# Patient Record
Sex: Male | Born: 1960 | Race: White | Hispanic: No | Marital: Married | Smoking: Never smoker
Health system: Southern US, Community
[De-identification: ages and names within clinical notes are randomized; demographics above are authoritative.]

## PROBLEM LIST (undated history)

## (undated) DIAGNOSIS — C801 Malignant (primary) neoplasm, unspecified: Secondary | ICD-10-CM

## (undated) DIAGNOSIS — J309 Allergic rhinitis, unspecified: Secondary | ICD-10-CM

## (undated) DIAGNOSIS — I712 Thoracic aortic aneurysm, without rupture, unspecified: Secondary | ICD-10-CM

## (undated) DIAGNOSIS — M2550 Pain in unspecified joint: Secondary | ICD-10-CM

## (undated) DIAGNOSIS — M5430 Sciatica, unspecified side: Secondary | ICD-10-CM

## (undated) DIAGNOSIS — I7789 Other specified disorders of arteries and arterioles: Secondary | ICD-10-CM

## (undated) DIAGNOSIS — D369 Benign neoplasm, unspecified site: Secondary | ICD-10-CM

## (undated) HISTORY — DX: Thoracic aortic aneurysm, without rupture, unspecified: I71.20

## (undated) HISTORY — DX: Sciatica, unspecified side: M54.30

## (undated) HISTORY — DX: Benign neoplasm, unspecified site: D36.9

## (undated) HISTORY — PX: COLONOSCOPY: SHX174

## (undated) HISTORY — DX: Pain in unspecified joint: M25.50

## (undated) HISTORY — DX: Allergic rhinitis, unspecified: J30.9

## (undated) HISTORY — DX: Thoracic aortic aneurysm, without rupture: I71.2

## (undated) HISTORY — PX: HERNIA REPAIR: SHX51

## (undated) HISTORY — DX: Other specified disorders of arteries and arterioles: I77.89

## (undated) HISTORY — PX: KNEE ARTHROSCOPY: SUR90

## (undated) HISTORY — PX: CARPAL TUNNEL RELEASE: SHX101

## (undated) HISTORY — PX: HIP ARTHROPLASTY: SHX981

## (undated) HISTORY — PX: VASECTOMY: SHX75

---
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
## 1987-04-13 DIAGNOSIS — J189 Pneumonia, unspecified organism: Secondary | ICD-10-CM

## 1987-04-13 HISTORY — DX: Pneumonia, unspecified organism: J18.9

## 2003-12-01 ENCOUNTER — Encounter: Admission: RE | Admit: 2003-12-01 | Discharge: 2003-12-01 | Payer: Self-pay | Admitting: Family Medicine

## 2004-01-22 ENCOUNTER — Encounter: Admission: RE | Admit: 2004-01-22 | Discharge: 2004-01-22 | Payer: Self-pay | Admitting: Neurosurgery

## 2004-02-06 ENCOUNTER — Encounter: Admission: RE | Admit: 2004-02-06 | Discharge: 2004-02-06 | Payer: Self-pay | Admitting: Neurosurgery

## 2004-02-20 ENCOUNTER — Encounter: Admission: RE | Admit: 2004-02-20 | Discharge: 2004-02-20 | Payer: Self-pay | Admitting: Neurosurgery

## 2004-03-03 ENCOUNTER — Encounter: Admission: RE | Admit: 2004-03-03 | Discharge: 2004-03-03 | Payer: Self-pay | Admitting: Neurosurgery

## 2013-02-05 ENCOUNTER — Other Ambulatory Visit: Payer: Self-pay | Admitting: Gastroenterology

## 2013-02-05 DIAGNOSIS — R634 Abnormal weight loss: Secondary | ICD-10-CM

## 2013-02-05 DIAGNOSIS — R197 Diarrhea, unspecified: Secondary | ICD-10-CM

## 2013-02-09 ENCOUNTER — Ambulatory Visit
Admission: RE | Admit: 2013-02-09 | Discharge: 2013-02-09 | Disposition: A | Discharge status: Home / Self Care | Payer: BC Managed Care – PPO | Source: Ambulatory Visit | Attending: Gastroenterology | Admitting: Gastroenterology

## 2013-02-09 DIAGNOSIS — R634 Abnormal weight loss: Secondary | ICD-10-CM

## 2013-02-09 DIAGNOSIS — R197 Diarrhea, unspecified: Secondary | ICD-10-CM

## 2013-02-09 MED ORDER — IOHEXOL 300 MG/ML  SOLN
100.0000 mL | Freq: Once | INTRAMUSCULAR | Status: AC | PRN
  Administered 2013-02-09: 100 mL via INTRAVENOUS

## 2013-02-28 ENCOUNTER — Encounter: Payer: BC Managed Care – PPO | Admitting: Surgery

## 2013-03-06 DIAGNOSIS — I7789 Other specified disorders of arteries and arterioles: Secondary | ICD-10-CM | POA: Insufficient documentation

## 2013-03-07 ENCOUNTER — Encounter: Payer: Self-pay | Admitting: Surgery

## 2013-03-07 ENCOUNTER — Other Ambulatory Visit: Payer: Self-pay | Admitting: *Deleted

## 2013-03-07 ENCOUNTER — Institutional Professional Consult (permissible substitution) (INDEPENDENT_AMBULATORY_CARE_PROVIDER_SITE_OTHER): Payer: BC Managed Care – PPO | Admitting: Surgery

## 2013-03-07 VITALS — BP 110/72 | HR 62 | Resp 16 | Ht 72.0 in | Wt 191.0 lb

## 2013-03-07 DIAGNOSIS — I7102 Dissection of abdominal aorta: Secondary | ICD-10-CM

## 2013-03-07 DIAGNOSIS — I7789 Other specified disorders of arteries and arterioles: Secondary | ICD-10-CM

## 2013-03-07 NOTE — Progress Notes (Signed)
Dennis Le [id number]    PCP is Dennis Rua, MD Referring Provider is Dennis Boys, MD  Chief Complaint  Patient presents with  . Thoracic Aortic Aneurysm    Only CT ABD/PELVIS done 02/09/13    HPI:  The patient is a 52 year old active gentleman who had a CT scan of the abdomen recently for work up of diarrhea. This showed enlargement of the proximal ascending aorta to about 4 cm. The scan only showed the proximal aortic root. The descending aorta measured 26 mm. He feels that his diarrhea may have been due to Celiac disease ( which he self-diagnosed) because since he went on a gluten-free diet all of his symptoms have resolved and he feels much better.  Past Medical History  Diagnosis Date  . Ascending aorta enlargement   . Allergic rhinitis   . Sciatica   . Tubular adenoma     x 3  . Joint pain   . Thoracic aortic aneurysm without mention of rupture     Past Surgical History  Procedure Laterality Date  . Colonoscopy      07/2010  . Hernia repair    . Vasectomy    . Carpal tunnel release      LEFT HAND  . Knee arthroscopy      1977    History reviewed. No pertinent family history. No hx of cardiac valve disease or aortic aneurysms  Social History History  Substance Use Topics  . Smoking status: Never Smoker   . Smokeless tobacco: Not on file  . Alcohol Use: No    Current Outpatient Prescriptions  Medication Sig Dispense Refill  . ibuprofen (ADVIL,MOTRIN) 200 MG tablet Take 200 mg by mouth every 6 (six) hours as needed.       No current facility-administered medications for this visit.    Allergies  Allergen Reactions  . Sulfa Antibiotics Nausea Only    Review of Systems  Constitutional: Negative.   HENT: Negative.   Eyes: Negative.   Respiratory: Negative.   Cardiovascular: Negative.   Gastrointestinal: Negative.   Endocrine: Negative.   Genitourinary: Negative.   Musculoskeletal: Negative.   Skin: Negative.     Allergic/Immunologic: Negative.   Neurological: Negative.   Hematological: Negative.   Psychiatric/Behavioral: Negative.     BP 110/72  Pulse 62  Resp 16  Ht 6' (1.829 m)  Wt 191 lb (86.637 kg)  BMI 25.90 kg/m2  SpO2 98% Physical Exam  Constitutional: He is oriented to person, place, and time. He appears well-developed and well-nourished. No distress.  HENT:  Head: Normocephalic and atraumatic.  Mouth/Throat: Oropharynx is clear and moist.  Eyes: Conjunctivae and EOM are normal. Pupils are equal, round, and reactive to light.  Neck: Normal range of motion. Neck supple. No JVD present. No thyromegaly present.  Cardiovascular: Normal rate, regular rhythm, normal heart sounds and intact distal pulses.  Exam reveals no gallop and no friction rub.   No murmur heard. Pulmonary/Chest: Effort normal and breath sounds normal. No respiratory distress. He has no rales.  Abdominal: Soft. Bowel sounds are normal. He exhibits no distension and no mass. There is no tenderness.  Musculoskeletal: He exhibits no edema.  Lymphadenopathy:    He has no cervical adenopathy.  Neurological: He is alert and oriented to person, place, and time. He has normal strength. No cranial nerve deficit or sensory deficit.  Skin: Skin is warm and dry.  Psychiatric: He has a normal mood and affect.  Diagnostic Tests:  CLINICAL DATA: 50 lb weight loss (30 lb planned). Diarrhea.  Occasional night sweats. Normal colonoscopy 2012.  EXAM:  CT ABDOMEN AND PELVIS WITH CONTRAST  TECHNIQUE:  Multidetector CT imaging of the abdomen and pelvis was performed  using the standard protocol following bolus administration of  intravenous contrast.  CONTRAST: OMNIPAQUE IOHEXOL 300 MG/ML SOLN  COMPARISON: None.  FINDINGS:  No extra luminal bowel inflammatory process, free fluid or free air.  Portions of the stomach and colon are under distended and evaluation  limited for detection of a mass. No obvious mass  noted.  Fluid filled prominent size loops of small bowel most notable  jejunum. This may be related to recent ingestion of contrast  although enteritis not excluded in the proper clinical setting.  No worrisome hepatic, splenic, pancreatic or adrenal lesion. Right  renal 8 mm and left renal 3 mm low-density structure too small to  characterize. No hydronephrosis.  No calcified gallstones.  No adenopathy.  Degenerative changes lower lumbar spine without bony destructive  lesion.  Tiny calcified granuloma right lung base otherwise visualized lung  bases are clear.  Ascending thoracic aorta appears enlarged measuring up to 4 cm.  Heart size within normal limits.  No abdominal aortic aneurysm. Minimal calcifications lower abdominal  aorta.  Non contrast filled underdistended views of the urinary bladder  unremarkable. Prostate gland does not appear enlarged although  minimally lobulated. Close correlation recommended.  Fatty containing right inguinal hernia. No bowel containing hernia.  IMPRESSION:  No extra luminal bowel inflammatory process, free fluid or free air.  Portions of the stomach and colon are under distended and evaluation  limited for detection of a mass. No obvious mass noted.  Fluid filled prominent size loops of small bowel most notable  jejunum. This may be related to recent ingestion of contrast  although enteritis not excluded in the proper clinical setting.  Right renal 8 mm and left renal 3 mm low-density structure too small  to characterize.  Ascending thoracic aorta appears enlarged measuring up to 4 cm.  Prostate gland does not appear enlarged although minimally  lobulated. Close correlation recommended.  This is a call report.  Electronically Signed  By: Dennis Le M.D.  On: 02/09/2013 11:35   Impression:  Asymptomatic enlargement of the proximal ascending aorta. It is unknown how long this has been present. I have recommended a CT angio of the chest to  evaluate the entire thoracic aorta. I usually recommend periodic follow-up until the aneurysm reaches 5.5 cm or if there is a period of rapid enlargement ( greater than 5 mm in a year). I reviewed the CT scan with him and answered his questions. He is in agreement with proceeding with a CTA of the chest. I discussed the importance of good blood pressure control and his BP is normal today at 110/72. If he develops hypertension he should be on a beta blocker.   Plan:  He will have a CT Angio of the chest and I sill see him back afterwards to discuss the result.

## 2013-03-14 ENCOUNTER — Ambulatory Visit
Admission: RE | Admit: 2013-03-14 | Discharge: 2013-03-14 | Disposition: A | Discharge status: Home / Self Care | Payer: BC Managed Care – PPO | Source: Ambulatory Visit | Attending: Surgery | Admitting: Surgery

## 2013-03-14 ENCOUNTER — Ambulatory Visit (INDEPENDENT_AMBULATORY_CARE_PROVIDER_SITE_OTHER): Payer: BC Managed Care – PPO | Admitting: Surgery

## 2013-03-14 ENCOUNTER — Encounter: Payer: Self-pay | Admitting: Surgery

## 2013-03-14 VITALS — BP 121/82 | HR 68 | Resp 20 | Ht 72.0 in | Wt 191.0 lb

## 2013-03-14 DIAGNOSIS — I7102 Dissection of abdominal aorta: Secondary | ICD-10-CM

## 2013-03-14 DIAGNOSIS — I7789 Other specified disorders of arteries and arterioles: Secondary | ICD-10-CM

## 2013-03-14 MED ORDER — IOHEXOL 350 MG/ML SOLN: 100.0000 mL | Freq: Once | INTRAVENOUS | Status: AC | PRN

## 2013-03-14 NOTE — Progress Notes (Signed)
     HPI:  He returns today to discuss the results of his CTA of the chest done to assess the entire thoracic aorta after a CT of the abdomen showed some enlargement of the proximal ascending aorta.  Current Outpatient Prescriptions  Medication Sig Dispense Refill  . ibuprofen (ADVIL,MOTRIN) 200 MG tablet Take 200 mg by mouth every 6 (six) hours as needed.       No current facility-administered medications for this visit.   Facility-Administered Medications Ordered in Other Visits  Medication Dose Route Frequency Provider Last Rate Last Dose  . iohexol (OMNIPAQUE) 350 MG/ML injection 100 mL  100 mL Intravenous Once PRN Medication Radiologist, MD         Physical Exam: BP 121/82  Pulse 68  Resp 20  Ht 6' (1.829 m)  Wt 191 lb (86.637 kg)  BMI 25.90 kg/m2  SpO2 97% He looks well. Cardiac exam shows a regular rate and rhythm with normal heart sounds.  Diagnostic Tests:  CLINICAL DATA: Ascending thoracic aortic aneurysm noted on CT  abdomen  EXAM:  CT ANGIOGRAPHY CHEST WITH CONTRAST  TECHNIQUE:  Multidetector CT imaging of the chest was performed using the  standard protocol during bolus administration of intravenous  contrast. Multiplanar CT image reconstructions including MIPs were  obtained to evaluate the vascular anatomy.  CONTRAST: 100 cc Omnipaque 350  COMPARISON: None.  FINDINGS:  Maximal diameters of the ascending aorta at the sinus of Valsalva,  sino-tubular junction, and ascending aorta are 3.9 cm, 3.3 cm, and  4.3 cm.  No evidence of dissection. Great vessels are patent. Vertebral  arteries are patent.  There is no obvious filling defect in the pulmonary arterial tree to  suggest acute pulmonary thromboembolism. No evidence of intramural  hematoma.  No abnormal mediastinal adenopathy. No pericardial effusion.  No pneumothorax or pleural effusion  Calcified granuloma at the right lateral lung base. No acute bony  deformity.  Review of the MIP images  confirms the above findings.  IMPRESSION:  Maximal diameter of the ascending aorta is 4.3 cm.  Electronically Signed  By: Maryclare Bean M.D.  On: 03/14/2013 15:51    Impression:  The ascending aortic diameter is 4.3 cm. This does not require treatment at this time but will require continued followup.  Plan:  I will see him back in 1 year with a CTA of the chest.

## 2014-02-12 ENCOUNTER — Other Ambulatory Visit: Payer: Self-pay

## 2014-02-12 DIAGNOSIS — I7789 Other specified disorders of arteries and arterioles: Secondary | ICD-10-CM

## 2014-03-19 ENCOUNTER — Telehealth: Payer: Self-pay | Admitting: *Deleted

## 2014-03-19 NOTE — Telephone Encounter (Signed)
Mr. Milich is being seen for a thoracic aortic aneurysm.  He was last seen in December '14 with a CTA  CHEST and was scheduled for  repeat scan this month.  He prefers to wait and have it repeated in June of next year.  I will inform Dr. Cyndia Bent of his decision.

## 2014-03-20 ENCOUNTER — Other Ambulatory Visit: Payer: BC Managed Care – PPO

## 2014-03-20 ENCOUNTER — Ambulatory Visit: Payer: BC Managed Care – PPO | Admitting: Surgery

## 2015-02-06 ENCOUNTER — Other Ambulatory Visit: Payer: Self-pay | Admitting: *Deleted

## 2015-02-06 DIAGNOSIS — I7789 Other specified disorders of arteries and arterioles: Secondary | ICD-10-CM

## 2015-02-26 ENCOUNTER — Other Ambulatory Visit: Payer: Self-pay

## 2015-02-26 ENCOUNTER — Ambulatory Visit: Payer: Self-pay | Admitting: Surgery

## 2015-04-21 ENCOUNTER — Ambulatory Visit
Admission: RE | Admit: 2015-04-21 | Discharge: 2015-04-21 | Disposition: A | Discharge status: Home / Self Care | Payer: 59 | Source: Ambulatory Visit | Attending: Surgery | Admitting: Surgery

## 2015-04-21 DIAGNOSIS — I7789 Other specified disorders of arteries and arterioles: Secondary | ICD-10-CM

## 2015-04-21 MED ORDER — IOPAMIDOL (ISOVUE-370) INJECTION 76%
75.0000 mL | Freq: Once | INTRAVENOUS | Status: AC | PRN
  Administered 2015-04-21: 75 mL via INTRAVENOUS

## 2015-04-23 ENCOUNTER — Ambulatory Visit (INDEPENDENT_AMBULATORY_CARE_PROVIDER_SITE_OTHER): Payer: 59 | Admitting: Surgery

## 2015-04-23 ENCOUNTER — Encounter: Payer: Self-pay | Admitting: Surgery

## 2015-04-23 VITALS — BP 137/80 | HR 60 | Resp 20 | Ht 72.0 in | Wt 223.0 lb

## 2015-04-23 DIAGNOSIS — I7121 Aneurysm of the ascending aorta, without rupture: Secondary | ICD-10-CM

## 2015-04-23 DIAGNOSIS — I712 Thoracic aortic aneurysm, without rupture: Secondary | ICD-10-CM | POA: Diagnosis not present

## 2015-04-23 NOTE — Progress Notes (Signed)
      HPI:  The patient returns today for follow up of his ascending aortic aneurysm. I saw him initially in 02/2013 and his ascending aorta measured 4.3 cm at that time. I recommended follow up in one year but he called at one year and said that he wanted to wait for two years to repeat his scan. He has been doing well without any chest pain or shortness of breath.  Current Outpatient Prescriptions  Medication Sig Dispense Refill  . ibuprofen (ADVIL,MOTRIN) 200 MG tablet Take 200 mg by mouth every 6 (six) hours as needed.     No current facility-administered medications for this visit.     Physical Exam: BP 137/80 mmHg  Pulse 60  Resp 20  Ht 6' (1.829 m)  Wt 223 lb (101.152 kg)  BMI 30.24 kg/m2  SpO2 95% He looks well. Cardiac exam shows a regular rate and rhythm with normal heart sounds.  Diagnostic Tests:  CLINICAL DATA: Followup dilation of the ascending thoracic aorta.  EXAM: CT ANGIOGRAPHY CHEST WITH CONTRAST  TECHNIQUE: Multidetector CT imaging of the chest was performed using the standard protocol during bolus administration of intravenous contrast. Multiplanar CT image reconstructions and MIPs were obtained to evaluate the vascular anatomy.  CONTRAST: 75 mL of Isovue 370 intravenous contrast  COMPARISON: 03/14/2013  FINDINGS: Vascular: The ascending thoracic aorta measures 4.4 cm, previously 4.3 cm. There is no aortic dissection. Aorta is normal in caliber along the arch and descending portions. Minimal partly calcified atherosclerotic plaque is noted along the arch and descending thoracic aorta.  Neck base and axilla: No mass or adenopathy. Visualized thyroid is unremarkable.  Mediastinum and hila: Heart normal in size and configuration. No mediastinal or hilar masses or enlarged lymph nodes  Lungs and pleural: Stable calcified granuloma at the base of the right lower lobe. Lungs otherwise clear. No pleural effusion  or pneumothorax.  Limited upper abdomen unremarkable.  Musculoskeletal: Mild disc degenerative changes along the thoracic spine. Otherwise unremarkable.  Review of the MIP images confirms the above findings.  IMPRESSION: 1. Maximum diameter of the ascending thoracic aorta is 4.4 cm, previously 4.3 2. No acute findings in the chest. Stable right lower lobe calcified granuloma.   Electronically Signed  By: Lajean Manes M.D.  On: 04/21/2015 12:20  Impression:  There has been minimal change in the diameter of the ascending aorta increasing 1 mm over the past two years. This is really within the margin of error for measurement. I have recommended that we repeat the scan in one year. I stressed he importance of good blood pressure control.  Plan:  Return in one year with CTA of the chest.   Gaye Pollack, MD [facility] [phone]

## 2016-03-30 ENCOUNTER — Other Ambulatory Visit: Payer: Self-pay | Admitting: Surgery

## 2016-03-30 DIAGNOSIS — I712 Thoracic aortic aneurysm, without rupture: Secondary | ICD-10-CM

## 2016-03-30 DIAGNOSIS — I7121 Aneurysm of the ascending aorta, without rupture: Secondary | ICD-10-CM

## 2016-04-28 ENCOUNTER — Other Ambulatory Visit: Payer: 59

## 2016-04-28 ENCOUNTER — Ambulatory Visit: Payer: 59 | Admitting: Surgery

## 2018-11-08 ENCOUNTER — Other Ambulatory Visit: Payer: Self-pay

## 2018-11-08 DIAGNOSIS — Z20822 Contact with and (suspected) exposure to covid-19: Secondary | ICD-10-CM

## 2018-11-09 LAB — NOVEL CORONAVIRUS, NAA: SARS-CoV-2, NAA: NOT DETECTED

## 2020-01-15 ENCOUNTER — Other Ambulatory Visit: Payer: Self-pay | Admitting: Surgery

## 2020-01-15 DIAGNOSIS — I712 Thoracic aortic aneurysm, without rupture, unspecified: Secondary | ICD-10-CM

## 2020-02-07 ENCOUNTER — Encounter: Payer: Self-pay | Admitting: Nurse Practitioner

## 2020-02-07 ENCOUNTER — Other Ambulatory Visit: Payer: Self-pay | Admitting: Nurse Practitioner

## 2020-02-07 DIAGNOSIS — Z6830 Body mass index (BMI) 30.0-30.9, adult: Secondary | ICD-10-CM | POA: Insufficient documentation

## 2020-02-07 DIAGNOSIS — U071 COVID-19: Secondary | ICD-10-CM

## 2020-02-07 DIAGNOSIS — I7789 Other specified disorders of arteries and arterioles: Secondary | ICD-10-CM

## 2020-02-07 NOTE — Progress Notes (Signed)
I connected by phone with Dennis Le on 02/07/2020 at 6:53 PM to discuss the potential use of a new treatment for mild to moderate COVID-19 viral infection in non-hospitalized patients.  This patient is a 59 y.o. male that meets the FDA criteria for Emergency Use Authorization of COVID monoclonal antibody casirivimab/imdevimab or bamlanivimab/eteseviamb.  Has a (+) direct SARS-CoV-2 viral test result  Has mild or moderate COVID-19   Is NOT hospitalized due to COVID-19  Is within 10 days of symptom onset  Has at least one of the high risk factor(s) for progression to severe COVID-19 and/or hospitalization as defined in EUA.  Specific high risk criteria : BMI > 25 and Cardiovascular disease or hypertension   I have spoken and communicated the following to the patient or parent/caregiver regarding COVID monoclonal antibody treatment:  1. FDA has authorized the emergency use for the treatment of mild to moderate COVID-19 in adults and pediatric patients with positive results of direct SARS-CoV-2 viral testing who are 82 years of age and older weighing at least 40 kg, and who are at high risk for progressing to severe COVID-19 and/or hospitalization.  2. The significant known and potential risks and benefits of COVID monoclonal antibody, and the extent to which such potential risks and benefits are unknown.  3. Information on available alternative treatments and the risks and benefits of those alternatives, including clinical trials.  4. Patients treated with COVID monoclonal antibody should continue to self-isolate and use infection control measures (e.g., wear mask, isolate, social distance, avoid sharing personal items, clean and disinfect "high touch" surfaces, and frequent handwashing) according to CDC guidelines.   5. The patient or parent/caregiver has the option to accept or refuse COVID monoclonal antibody treatment.  After reviewing this information with the patient, the  patient has agreed to receive one of the available covid 19 monoclonal antibodies and will be provided an appropriate fact sheet prior to infusion. Orma Render, NP 02/07/2020 6:53 PM

## 2020-02-08 ENCOUNTER — Ambulatory Visit (HOSPITAL_COMMUNITY)
Admission: RE | Admit: 2020-02-08 | Discharge: 2020-02-08 | Disposition: A | Discharge status: Home / Self Care | Payer: 59 | Source: Ambulatory Visit | Attending: Pulmonary Disease | Admitting: Pulmonary Disease

## 2020-02-08 DIAGNOSIS — Z6830 Body mass index (BMI) 30.0-30.9, adult: Secondary | ICD-10-CM | POA: Insufficient documentation

## 2020-02-08 DIAGNOSIS — U071 COVID-19: Secondary | ICD-10-CM

## 2020-02-08 DIAGNOSIS — I7789 Other specified disorders of arteries and arterioles: Secondary | ICD-10-CM | POA: Insufficient documentation

## 2020-02-08 MED ORDER — METHYLPREDNISOLONE SODIUM SUCC 125 MG IJ SOLR: 125.0000 mg | Freq: Once | INTRAMUSCULAR | Status: DC | PRN

## 2020-02-08 MED ORDER — DIPHENHYDRAMINE HCL 50 MG/ML IJ SOLN: 50.0000 mg | Freq: Once | INTRAMUSCULAR | Status: DC | PRN

## 2020-02-08 MED ORDER — ALBUTEROL SULFATE HFA 108 (90 BASE) MCG/ACT IN AERS: 2.0000 | INHALATION_SPRAY | Freq: Once | RESPIRATORY_TRACT | Status: DC | PRN

## 2020-02-08 MED ORDER — EPINEPHRINE 0.3 MG/0.3ML IJ SOAJ: 0.3000 mg | Freq: Once | INTRAMUSCULAR | Status: DC | PRN

## 2020-02-08 MED ORDER — SODIUM CHLORIDE 0.9 % IV SOLN: INTRAVENOUS | Status: DC | PRN

## 2020-02-08 MED ORDER — FAMOTIDINE IN NACL 20-0.9 MG/50ML-% IV SOLN: 20.0000 mg | Freq: Once | INTRAVENOUS | Status: DC | PRN

## 2020-02-08 MED ORDER — SODIUM CHLORIDE 0.9 % IV SOLN: Freq: Once | INTRAVENOUS | Status: AC

## 2020-02-08 NOTE — Progress Notes (Signed)
  Diagnosis: COVID-19  Physician: Asencion Noble   Procedure: [facility] Med: bamlanivimab\etesevimab infusion - Provided patient with bamlanimivab\etesevimab fact sheet for patients, parents and caregivers prior to infusion.  Complications: No immediate complications noted.  Discharge: Discharged home   [place] 02/08/2020

## 2020-02-08 NOTE — Discharge Instructions (Signed)

## 2020-02-20 ENCOUNTER — Other Ambulatory Visit: Payer: Self-pay

## 2020-02-20 ENCOUNTER — Encounter: Payer: Self-pay | Admitting: Surgery

## 2020-02-20 ENCOUNTER — Ambulatory Visit: Payer: 59 | Admitting: Surgery

## 2020-02-20 ENCOUNTER — Ambulatory Visit
Admission: RE | Admit: 2020-02-20 | Discharge: 2020-02-20 | Disposition: A | Discharge status: Home / Self Care | Payer: 59 | Source: Ambulatory Visit | Attending: Surgery | Admitting: Surgery

## 2020-02-20 VITALS — BP 138/86 | HR 69 | Resp 20 | Ht 72.0 in | Wt 232.0 lb

## 2020-02-20 DIAGNOSIS — I712 Thoracic aortic aneurysm, without rupture, unspecified: Secondary | ICD-10-CM

## 2020-02-20 MED ORDER — IOPAMIDOL (ISOVUE-370) INJECTION 76%
75.0000 mL | Freq: Once | INTRAVENOUS | Status: AC | PRN
  Administered 2020-02-20: 75 mL via INTRAVENOUS

## 2020-02-20 NOTE — Progress Notes (Signed)
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  I last saw him in January 2017 and we had plan to see him in follow-up in 1 year but he just returned.  He has been feeling well without chest pain or shortness of breath.  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  He is a lifelong non-smoker and does not use smokeless tobacco..  Current Outpatient Medications  Medication Sig Dispense Refill  . ibuprofen (ADVIL,MOTRIN) 200 MG tablet Take 200 mg by mouth every 6 (six) hours as needed.     No current facility-administered medications for this visit.     Physical Exam: BP 138/86   Pulse 69   Resp 20   Ht 6' (1.829 m)   Wt 232 lb (105.2 kg)   SpO2 95% Comment: RA  BMI 31.46 kg/m  He looks well. There is no cervical or supraclavicular adenopathy. Cardiac exam shows regular rate and rhythm with normal heart sounds.  There is no murmur. Lungs are clear.  Diagnostic Tests:  Narrative & Impression  CLINICAL DATA:  Follow-up thoracic aortic aneurysm  EXAM: CT ANGIOGRAPHY CHEST WITH CONTRAST  TECHNIQUE: Multidetector CT imaging of the chest was performed using the standard protocol during bolus administration of intravenous contrast. Multiplanar CT image reconstructions and MIPs were obtained to evaluate the vascular anatomy.  CONTRAST:  24mL ISOVUE-370 IOPAMIDOL (ISOVUE-370) INJECTION 76%  COMPARISON:  04/21/2015  FINDINGS: Cardiovascular: Normal heart size. No pericardial effusion. Preferential opacification of the thoracic aorta. Maximal diameter of the mid ascending thoracic aorta is 4.3 cm, unchanged from prior. Diameter of the aortic arch and descending thoracic aorta are normal. No aortic dissection. Three vessel arch, with widely patent branch vessels. Minimal atherosclerotic calcification along the aortic arch.  Central pulmonary vasculature is within normal limits. No central pulmonary arterial filling defect.  Mediastinum/Nodes: No enlarged mediastinal, hilar, or axillary lymph nodes. Thyroid gland, trachea, and esophagus demonstrate no significant findings.  Lungs/Pleura: Part solid pulmonary nodule within the peripheral aspect of the right upper lobe measuring 10 mm in diameter with solid component measuring approximately 8 mm (series 6, image 47), new from prior. There are a few scattered areas of ground-glass opacity within the right upper lobe and right middle lobe (for example series 6, images 55 and 105). Additional small focal area of ground-glass within the periphery of the left lower lobe (series 6, image 90). Stable 5 mm calcified granuloma within the right lung base. No pleural effusion or pneumothorax.  Upper Abdomen: No acute abnormality.  Musculoskeletal: No chest wall abnormality. No acute or significant osseous findings.  Review of the MIP images confirms the above findings.  IMPRESSION: 1. Stable aneurysmal dilatation of the mid ascending thoracic aorta measuring up to 4.3 cm in diameter. 2. Part solid pulmonary nodule within the peripheral aspect of the right upper lobe measuring 10 mm in diameter, new from prior exam. Follow-up non-contrast CT recommended at 3-6 months to confirm persistence. If unchanged, and solid component is <6 mm, annual CT is recommended until 5 years of stability has been established. If persistent these nodules should be considered highly suspicious if the solid component of the nodule is 6 mm or greater in size and enlarging. This recommendation follows the consensus statement: Guidelines for Management of Incidental Pulmonary Nodules Detected on CT Images: From the Fleischner Society 2017;  Radiology 2017; [phone]. 3. There are a few scattered areas of ground-glass opacity within the right upper lobe, right middle lobe, and left  lower lobe. Findings may represent an infectious or inflammatory process. Attention on follow-up.  These results will be called to the ordering clinician or representative by the Radiologist Assistant, and communication documented in the PACS or [employer].   Electronically Signed   By: Davina Poke D.O.   On: 02/20/2020 09:13     Impression:  This 59 year old gentleman has a stable 4.3 cm fusiform ascending aortic aneurysm.  This is stable dating back to 59  It is well below the surgical threshold of 5.5 cm.  Given its long history of stability I think it is reasonable to wait 2 years to repeat CT scan for this aneurysm.  I reviewed the CT images with him and answered his questions.  I stressed the importance of continued good blood pressure control and preventing further enlargement and acute aortic dissection.  His CT scan today also showed a new 10 mm part solid nodule in the peripheral aspect of the right upper lobe.  Radiology has recommended a follow-up CT of the chest in 3 to 6 months.  There are also a few other scattered areas of groundglass opacity in the right upper lobe right middle lobe and left lower lobe which are likely inflammatory or infectious.  These can be followed up on subsequent scanning.  I reviewed the images with him and answered his questions.  We decided to repeat his CT scan of the chest without contrast in 4 months.  Plan:  He will return to see me in 4 months with a CT scan of the chest without contrast to follow-up on his new right upper lobe pulmonary nodule.  We will schedule further follow-up scans depending on results of that scan.  He will require follow-up of his ascending aortic aneurysm in about 2 years.  I spent 20 minutes performing this established patient evaluation and > 50% of this time was spent face to face counseling and coordinating the care of this patient's aortic aneurysm and new right upper lobe pulmonary  nodule.    Gaye Pollack, MD [facility] [phone]

## 2020-05-19 ENCOUNTER — Other Ambulatory Visit: Payer: Self-pay | Admitting: Surgery

## 2020-05-19 DIAGNOSIS — R911 Solitary pulmonary nodule: Secondary | ICD-10-CM

## 2020-06-18 ENCOUNTER — Other Ambulatory Visit: Payer: 59

## 2020-06-18 ENCOUNTER — Ambulatory Visit: Payer: 59 | Admitting: Surgery

## 2020-08-13 ENCOUNTER — Other Ambulatory Visit: Payer: 59

## 2020-08-13 ENCOUNTER — Encounter: Payer: Self-pay | Admitting: Surgery

## 2020-08-13 ENCOUNTER — Ambulatory Visit: Payer: 59 | Admitting: Surgery

## 2020-08-13 ENCOUNTER — Other Ambulatory Visit: Payer: Self-pay

## 2020-08-13 ENCOUNTER — Ambulatory Visit
Admission: RE | Admit: 2020-08-13 | Discharge: 2020-08-13 | Disposition: A | Discharge status: Home / Self Care | Payer: 59 | Source: Ambulatory Visit | Attending: Surgery | Admitting: Surgery

## 2020-08-13 VITALS — BP 134/78 | HR 79 | Resp 20 | Ht 72.0 in | Wt 212.0 lb

## 2020-08-13 DIAGNOSIS — I712 Thoracic aortic aneurysm, without rupture, unspecified: Secondary | ICD-10-CM

## 2020-08-13 DIAGNOSIS — R911 Solitary pulmonary nodule: Secondary | ICD-10-CM | POA: Diagnosis not present

## 2020-08-13 NOTE — Progress Notes (Signed)
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 His last CT in November 2021 also showed  a new 10 mm part solid nodule in the peripheral aspect of the right upper lobe.  Radiology has recommended a follow-up CT of the chest in 3 to 6 months.  There are also a few other scattered areas of groundglass opacity in the right upper lobe right middle lobe and left lower lobe which are likely inflammatory or infectious. He has been feeling well with no cough or shortness of breath.   Current Outpatient Medications  Medication Sig Dispense Refill  . ibuprofen (ADVIL,MOTRIN) 200 MG tablet Take 200 mg by mouth every 6 (six) hours as needed.     No current facility-administered medications for this visit.     Physical Exam: BP 134/78   Pulse 79   Resp 20   Ht 6' (1.829 m)   Wt 96.2 kg   SpO2 95% Comment: RA  BMI 28.75 kg/m  He looks well There is no cervical or supraclavicular adenopathy. Cardiac exam shows a regular rate and rhythm with normal heart sounds. This is no murmur. Lungs are clear.  Diagnostic Tests:  Narrative & Impression  CLINICAL DATA:  Follow up right upper lobe pulmonary nodule. Cough with allergens. No history of malignancy or prior relevant surgery.  EXAM: CT CHEST WITHOUT CONTRAST  TECHNIQUE: Multidetector CT imaging of the chest was performed following the standard protocol without IV contrast.  COMPARISON:  Chest CT 02/20/2020 and 04/21/2015.  FINDINGS: Cardiovascular: Minimal aortic and coronary artery atherosclerosis again noted. There is stable dilatation of the ascending aorta to 4.3 cm. No acute vascular findings on noncontrast imaging. The heart size is normal. There is no pericardial effusion.  Mediastinum/Nodes: There are no enlarged mediastinal, hilar or axillary lymph nodes.Hilar assessment is limited by the lack  of intravenous contrast, although the hilar contours appear unchanged. The thyroid gland, trachea and esophagus demonstrate no significant findings.  Lungs/Pleura: There is no pleural effusion or pneumothorax. Interval resolution of dominant part solid nodule previously demonstrated in the right upper lobe. Additional scattered areas of ground-glass density have also resolved. A few scattered tiny subpleural nodules are stable, consistent with benign findings. There are small calcified right lung granulomas. No suspicious pulmonary nodules.  Upper abdomen: The visualized upper abdomen appears stable without significant findings.  Musculoskeletal/Chest wall: Interval development of several posterior rib fractures on the left involving the 5th through 8th ribs. The fractures of the 6 and 7th ribs are mildly displaced. These fractures appear subacute with some callus formation. No chest wall mass.  IMPRESSION: 1. Interval resolution of dominant part solid nodule in the right upper lobe and additional patchy ground-glass opacities in both lungs. No suspicious pulmonary nodules. 2. Interval development of several left-sided rib fractures posteriorly, including mildly displaced fractures of the left 6th and 7th ribs. These fractures appear subacute. 3. Stable dilatation of the ascending aorta having a maximal diameter of 4.3 cm. Mild Aortic Atherosclerosis (ICD10-I70.0).   Electronically Signed   By: Richardean Sale M.D.   On: 08/13/2020 10:32      Impression:  There has been Interval resolution of the dominant part solid nodule in the right upper lobe and additional patchy ground-glass opacities in both lungs. There are no suspicious pulmonary nodules. The 4.3 cm fusiform ascending aortic aneurysm is stable. I reviewed  the CT images with the patient and answered his questions.     Plan:  He will return in one year with a CTA of the chest to follow up on his ascending  aortic aneurysm.  I spent 20 minutes performing this established patient evaluation and > 50% of this time was spent face to face counseling and coordinating the care of this patient's pulmonary nodule and aortic aneurysm.    Gaye Pollack, MD [facility] [phone]

## 2020-11-18 ENCOUNTER — Other Ambulatory Visit: Payer: Self-pay

## 2021-02-04 NOTE — Progress Notes (Signed)
Sent message, via epic in basket, requesting orders in epic from surgeon.  

## 2021-02-09 ENCOUNTER — Ambulatory Visit: Payer: Self-pay | Admitting: Student

## 2021-02-09 NOTE — H&P (View-Only) (Signed)
TOTAL KNEE ADMISSION H&P  Patient is being admitted for left total knee arthroplasty.  Subjective:  Chief Complaint:left knee pain.  HPI: Dennis Le, 60 y.o. male, has a history of pain and functional disability in the left knee due to arthritis and has failed non-surgical conservative treatments for greater than 12 weeks to includeNSAID's and/or analgesics, corticosteriod injections, and activity modification.  Onset of symptoms was gradual, starting 3 years ago with gradually worsening course since that time. The patient noted prior procedures on the knee to include  menisectomy on the left knee(s).  Patient currently rates pain in the left knee(s) at 8 out of 10 with activity. Patient has worsening of pain with activity and weight bearing, pain that interferes with activities of daily living, and pain with passive range of motion.  Patient has evidence of subchondral cysts, subchondral sclerosis, and joint space narrowing by imaging studies. There is no active infection.  Patient Active Problem List   Diagnosis Date Noted   COVID-19 02/07/2020   BMI 30.0-30.9,adult 02/07/2020   Ascending aorta enlargement [facility])    Past Medical History:  Diagnosis Date   Allergic rhinitis    Ascending aorta enlargement (HCC)    Joint pain    Sciatica    Thoracic aortic aneurysm without mention of rupture    Tubular adenoma    x 3    Past Surgical History:  Procedure Laterality Date   CARPAL TUNNEL RELEASE     LEFT HAND   COLONOSCOPY     07/2010   HERNIA REPAIR     KNEE ARTHROSCOPY     1977   VASECTOMY      Current Outpatient Medications  Medication Sig Dispense Refill Last Dose   ibuprofen (ADVIL,MOTRIN) 200 MG tablet Take 200 mg by mouth every 6 (six) hours as needed.      No current facility-administered medications for this visit.   Allergies  Allergen Reactions   Sulfa Antibiotics Nausea Only    Social History   Tobacco Use   Smoking status: Never   Smokeless tobacco:  Never  Substance Use Topics   Alcohol use: No    No family history on file.   Review of Systems  Musculoskeletal:  Positive for arthralgias.  All other systems reviewed and are negative.  Objective:  Physical Exam HENT:     Head: Normocephalic.  Eyes:     Pupils: Pupils are equal, round, and reactive to light.  Cardiovascular:     Rate and Rhythm: Normal rate.  Pulmonary:     Effort: Pulmonary effort is normal.  Abdominal:     Palpations: Abdomen is soft.  Genitourinary:    Comments: Deferred Musculoskeletal:        General: Tenderness present.     Cervical back: Normal range of motion.  Skin:    General: Skin is warm and dry.  Neurological:     Mental Status: He is alert and oriented to person, place, and time.  Psychiatric:        Behavior: Behavior normal.    Vital signs in last 24 hours: @VSRANGES @  Labs:   Estimated body mass index is 28.75 kg/m as calculated from the following:   Height as of 08/13/20: 6' (1.829 m).   Weight as of 08/13/20: 96.2 kg.   Imaging Review Plain radiographs demonstrate severe degenerative joint disease of the left knee(s). The bone quality appears to be adequate for age and reported activity level.      Assessment/Plan:  End  stage arthritis, left knee   The patient history, physical examination, clinical judgment of the provider and imaging studies are consistent with end stage degenerative joint disease of the left knee(s) and total knee arthroplasty is deemed medically necessary. The treatment options including medical management, injection therapy arthroscopy and arthroplasty were discussed at length. The risks and benefits of total knee arthroplasty were presented and reviewed. The risks due to aseptic loosening, infection, stiffness, patella tracking problems, thromboembolic complications and other imponderables were discussed. The patient acknowledged the explanation, agreed to proceed with the plan and consent was signed.  Patient is being admitted for inpatient treatment for surgery, pain control, PT, OT, prophylactic antibiotics, VTE prophylaxis, progressive ambulation and ADL's and discharge planning. The patient is planning to be discharged  home same day     Patient's anticipated LOS is less than 2 midnights, meeting these requirements: - Younger than 38 - Lives within 1 hour of care - Has a competent adult at home to recover with post-op recover - NO history of  - Chronic pain requiring opiods  - Diabetes  - Coronary Artery Disease  - Heart failure  - Heart attack  - Stroke  - DVT/VTE  - Cardiac arrhythmia  - Respiratory Failure/COPD  - Renal failure  - Anemia  - Advanced Liver disease

## 2021-02-09 NOTE — H&P (Signed)
TOTAL KNEE ADMISSION H&P  Patient is being admitted for left total knee arthroplasty.  Subjective:  Chief Complaint:left knee pain.  HPI: Dennis Le, 60 y.o. male, has a history of pain and functional disability in the left knee due to arthritis and has failed non-surgical conservative treatments for greater than 12 weeks to includeNSAID's and/or analgesics, corticosteriod injections, and activity modification.  Onset of symptoms was gradual, starting 3 years ago with gradually worsening course since that time. The patient noted prior procedures on the knee to include  menisectomy on the left knee(s).  Patient currently rates pain in the left knee(s) at 8 out of 10 with activity. Patient has worsening of pain with activity and weight bearing, pain that interferes with activities of daily living, and pain with passive range of motion.  Patient has evidence of subchondral cysts, subchondral sclerosis, and joint space narrowing by imaging studies. There is no active infection.  Patient Active Problem List   Diagnosis Date Noted   COVID-19 02/07/2020   BMI 30.0-30.9,adult 02/07/2020   Ascending aorta enlargement [facility])    Past Medical History:  Diagnosis Date   Allergic rhinitis    Ascending aorta enlargement (HCC)    Joint pain    Sciatica    Thoracic aortic aneurysm without mention of rupture    Tubular adenoma    x 3    Past Surgical History:  Procedure Laterality Date   CARPAL TUNNEL RELEASE     LEFT HAND   COLONOSCOPY     07/2010   HERNIA REPAIR     KNEE ARTHROSCOPY     1977   VASECTOMY      Current Outpatient Medications  Medication Sig Dispense Refill Last Dose   ibuprofen (ADVIL,MOTRIN) 200 MG tablet Take 200 mg by mouth every 6 (six) hours as needed.      No current facility-administered medications for this visit.   Allergies  Allergen Reactions   Sulfa Antibiotics Nausea Only    Social History   Tobacco Use   Smoking status: Never   Smokeless tobacco:  Never  Substance Use Topics   Alcohol use: No    No family history on file.   Review of Systems  Musculoskeletal:  Positive for arthralgias.  All other systems reviewed and are negative.  Objective:  Physical Exam HENT:     Head: Normocephalic.  Eyes:     Pupils: Pupils are equal, round, and reactive to light.  Cardiovascular:     Rate and Rhythm: Normal rate.  Pulmonary:     Effort: Pulmonary effort is normal.  Abdominal:     Palpations: Abdomen is soft.  Genitourinary:    Comments: Deferred Musculoskeletal:        General: Tenderness present.     Cervical back: Normal range of motion.  Skin:    General: Skin is warm and dry.  Neurological:     Mental Status: He is alert and oriented to person, place, and time.  Psychiatric:        Behavior: Behavior normal.    Vital signs in last 24 hours: @VSRANGES @  Labs:   Estimated body mass index is 28.75 kg/m as calculated from the following:   Height as of 08/13/20: 6' (1.829 m).   Weight as of 08/13/20: 96.2 kg.   Imaging Review Plain radiographs demonstrate severe degenerative joint disease of the left knee(s). The bone quality appears to be adequate for age and reported activity level.      Assessment/Plan:  End  stage arthritis, left knee   The patient history, physical examination, clinical judgment of the provider and imaging studies are consistent with end stage degenerative joint disease of the left knee(s) and total knee arthroplasty is deemed medically necessary. The treatment options including medical management, injection therapy arthroscopy and arthroplasty were discussed at length. The risks and benefits of total knee arthroplasty were presented and reviewed. The risks due to aseptic loosening, infection, stiffness, patella tracking problems, thromboembolic complications and other imponderables were discussed. The patient acknowledged the explanation, agreed to proceed with the plan and consent was signed.  Patient is being admitted for inpatient treatment for surgery, pain control, PT, OT, prophylactic antibiotics, VTE prophylaxis, progressive ambulation and ADL's and discharge planning. The patient is planning to be discharged  home same day     Patient's anticipated LOS is less than 2 midnights, meeting these requirements: - Younger than 5 - Lives within 1 hour of care - Has a competent adult at home to recover with post-op recover - NO history of  - Chronic pain requiring opiods  - Diabetes  - Coronary Artery Disease  - Heart failure  - Heart attack  - Stroke  - DVT/VTE  - Cardiac arrhythmia  - Respiratory Failure/COPD  - Renal failure  - Anemia  - Advanced Liver disease

## 2021-02-11 ENCOUNTER — Ambulatory Visit: Payer: Self-pay | Admitting: Student

## 2021-02-11 DIAGNOSIS — M1712 Unilateral primary osteoarthritis, left knee: Secondary | ICD-10-CM

## 2021-02-11 NOTE — Patient Instructions (Addendum)
DUE TO COVID-19 ONLY ONE VISITOR IS ALLOWED TO COME WITH YOU AND STAY IN THE WAITING ROOM ONLY DURING PRE OP AND PROCEDURE DAY OF SURGERY IF YOU ARE GOING HOME AFTER SURGERY.                 Dennis Le     Your procedure is scheduled on: 02/25/21   Report to [facility] Main  Entrance   Report to short stay at 5:45 AM     Call this number if you have problems the morning of surgery [phone]    No food after midnight.    You may have clear liquid until 5:30 AM.    At 5:00 AM drink pre surgery drink.   Nothing by mouth after 5:30 AM.   CLEAR LIQUID DIET   Foods Allowed                                                                     Foods Excluded                                                                                        liquids that you cannot  Plain Jell-O any favor except red or purple                                           see through such as: Fruit ices (not with fruit pulp)                                     milk, soups, orange juice  Iced Popsicles                                    All solid food Carbonated beverages, regular and diet                                    Cranberry, grape and apple juices Sports drinks like Gatorade Lightly seasoned clear broth or consume(fat free) Sugar     BRUSH YOUR TEETH MORNING OF SURGERY AND RINSE YOUR MOUTH OUT, NO CHEWING GUM CANDY OR MINTS.     Take these medicines the morning of surgery with A SIP OF WATER: none                                You may not have any metal on your body including              piercings  Do not wear  jewelry, lotions, powders or  deodorant             Men may shave face and neck.   Do not bring valuables to the hospital. [place].  Contacts, dentures or bridgework may not be worn into surgery.     Patients discharged the day of surgery will not be allowed to drive home.  IF YOU ARE HAVING SURGERY AND  GOING HOME THE SAME DAY, YOU MUST HAVE AN ADULT TO DRIVE YOU HOME AND BE WITH YOU FOR 24 HOURS. YOU MAY GO HOME BY TAXI OR UBER OR ORTHERWISE, BUT AN ADULT MUST ACCOMPANY YOU HOME AND STAY WITH YOU FOR 24 HOURS.  Name and phone number of your driver:  Special Instructions: N/A              Please read over the following fact sheets you were given: _____________________________________________________________________             [facility] - Preparing for Surgery Before surgery, you can play an important role.  Because skin is not sterile, your skin needs to be as free of germs as possible.  You can reduce the number of germs on your skin by washing with CHG (chlorahexidine gluconate) soap before surgery.  CHG is an antiseptic cleaner which kills germs and bonds with the skin to continue killing germs even after washing. Please DO NOT use if you have an allergy to CHG or antibacterial soaps.  If your skin becomes reddened/irritated stop using the CHG and inform your nurse when you arrive at Short Stay. You may shave your face/neck. Please follow these instructions carefully:  1.  Shower with CHG Soap the night before surgery and the  morning of Surgery.  2.  If you choose to wash your hair, wash your hair first as usual with your  normal  shampoo.  3.  After you shampoo, rinse your hair and body thoroughly to remove the  shampoo.                            4.  Use CHG as you would any other liquid soap.  You can apply chg directly  to the skin and wash                       Gently with a scrungie or clean washcloth.  5.  Apply the CHG Soap to your body ONLY FROM THE NECK DOWN.   Do not use on face/ open                           Wound or open sores. Avoid contact with eyes, ears mouth and genitals (private parts).                       Wash face,  Genitals (private parts) with your normal soap.             6.  Wash thoroughly, paying special attention to the area where your surgery  will be  performed.  7.  Thoroughly rinse your body with warm water from the neck down.  8.  DO NOT shower/wash with your normal soap after using and rinsing off  the CHG Soap.  9.  Pat yourself dry with a clean towel.            10.  Wear clean pajamas.            11.  Place clean sheets on your bed the night of your first shower and do not  sleep with pets. Day of Surgery : Do not apply any lotions/deodorants the morning of surgery.  Please wear clean clothes to the hospital/surgery center.  FAILURE TO FOLLOW THESE INSTRUCTIONS MAY RESULT IN THE CANCELLATION OF YOUR SURGERY PATIENT SIGNATURE_________________________________  NURSE SIGNATURE__________________________________  ________________________________________________________________________   Dennis Le  An incentive spirometer is a tool that can help keep your lungs clear and active. This tool measures how well you are filling your lungs with each breath. Taking long deep breaths may help reverse or decrease the chance of developing breathing (pulmonary) problems (especially infection) following: A long period of time when you are unable to move or be active. BEFORE THE PROCEDURE  If the spirometer includes an indicator to show your best effort, your nurse or respiratory therapist will set it to a desired goal. If possible, sit up straight or lean slightly forward. Try not to slouch. Hold the incentive spirometer in an upright position. INSTRUCTIONS FOR USE  Sit on the edge of your bed if possible, or sit up as far as you can in bed or on a chair. Hold the incentive spirometer in an upright position. Breathe out normally. Place the mouthpiece in your mouth and seal your lips tightly around it. Breathe in slowly and as deeply as possible, raising the piston or the ball toward the top of the column. Hold your breath for 3-5 seconds or for as long as possible. Allow the piston or ball to fall to the bottom of the  column. Remove the mouthpiece from your mouth and breathe out normally. Rest for a few seconds and repeat Steps 1 through 7 at least 10 times every 1-2 hours when you are awake. Take your time and take a few normal breaths between deep breaths. The spirometer may include an indicator to show your best effort. Use the indicator as a goal to work toward during each repetition. After each set of 10 deep breaths, practice coughing to be sure your lungs are clear. If you have an incision (the cut made at the time of surgery), support your incision when coughing by placing a pillow or rolled up towels firmly against it. Once you are able to get out of bed, walk around indoors and cough well. You may stop using the incentive spirometer when instructed by your caregiver.  RISKS AND COMPLICATIONS Take your time so you do not get dizzy or light-headed. If you are in pain, you may need to take or ask for pain medication before doing incentive spirometry. It is harder to take a deep breath if you are having pain. AFTER USE Rest and breathe slowly and easily. It can be helpful to keep track of a log of your progress. Your caregiver can provide you with a simple table to help with this. If you are using the spirometer at home, follow these instructions: [place] IF:  You are having difficultly using the spirometer. You have trouble using the spirometer as often as instructed. Your pain medication is not giving enough relief while using the spirometer. You develop fever of 100.5 F (38.1 C) or higher. SEEK IMMEDIATE MEDICAL CARE IF:  You cough up bloody sputum that had not  been present before. You develop fever of 102 F (38.9 C) or greater. You develop worsening pain at or near the incision site. MAKE SURE YOU:  Understand these instructions. Will watch your condition. Will get help right away if you are not doing well or get worse. Document Released: 08/09/2006 Document Revised: 06/21/2011  Document Reviewed: 10/10/2006 [facility] Patient Information 2014 [place], [place].   ________________________________________________________________________

## 2021-02-12 ENCOUNTER — Other Ambulatory Visit: Payer: Self-pay

## 2021-02-12 ENCOUNTER — Encounter (HOSPITAL_COMMUNITY): Payer: Self-pay

## 2021-02-12 ENCOUNTER — Encounter (HOSPITAL_COMMUNITY)
Admission: RE | Admit: 2021-02-12 | Discharge: 2021-02-12 | Disposition: A | Discharge status: Home / Self Care | Payer: 59 | Source: Ambulatory Visit | Attending: Orthopedic Surgery | Admitting: Orthopedic Surgery

## 2021-02-12 VITALS — BP 134/80 | HR 74 | Temp 98.0°F | Resp 18 | Ht 71.0 in | Wt 216.5 lb

## 2021-02-12 DIAGNOSIS — Z01812 Encounter for preprocedural laboratory examination: Secondary | ICD-10-CM | POA: Insufficient documentation

## 2021-02-12 DIAGNOSIS — I714 Abdominal aortic aneurysm, without rupture, unspecified: Secondary | ICD-10-CM | POA: Diagnosis not present

## 2021-02-12 DIAGNOSIS — M1712 Unilateral primary osteoarthritis, left knee: Secondary | ICD-10-CM | POA: Diagnosis not present

## 2021-02-12 DIAGNOSIS — Z01818 Encounter for other preprocedural examination: Secondary | ICD-10-CM

## 2021-02-12 LAB — COMPREHENSIVE METABOLIC PANEL
ALT: 16 U/L (ref 0–44)
AST: 24 U/L (ref 15–41)
Albumin: 4.1 g/dL (ref 3.5–5.0)
Alkaline Phosphatase: 114 U/L (ref 38–126)
Anion gap: 8 (ref 5–15)
BUN: 14 mg/dL (ref 8–23)
CO2: 26 mmol/L (ref 22–32)
Calcium: 9.1 mg/dL (ref 8.9–10.3)
Chloride: 102 mmol/L (ref 98–111)
Creatinine, Ser: 0.89 mg/dL (ref 0.61–1.24)
GFR, Estimated: >60 mL/min (ref >60)
Glucose, Bld: 103 mg/dL — ABNORMAL HIGH (ref 70–99)
Potassium: 4 mmol/L (ref 3.5–5.1)
Sodium: 136 mmol/L (ref 135–145)
Total Bilirubin: 0.6 mg/dL (ref 0.3–1.2)
Total Protein: 7.3 g/dL (ref 6.5–8.1)

## 2021-02-12 LAB — CBC
HCT: 42.1 % (ref 39.0–52.0)
Hemoglobin: 14.1 g/dL (ref 13.0–17.0)
MCH: 30.9 pg (ref 26.0–34.0)
MCHC: 33.5 g/dL (ref 30.0–36.0)
MCV: 92.1 fL (ref 80.0–100.0)
Platelets: 324 10*3/uL (ref 150–400)
RBC: 4.57 MIL/uL (ref 4.22–5.81)
RDW: 13.2 % (ref 11.5–15.5)
WBC: 6.5 10*3/uL (ref 4.0–10.5)
nRBC: 0 % (ref 0.0–0.2)

## 2021-02-12 LAB — SURGICAL PCR SCREEN
MRSA, PCR: NEGATIVE
Staphylococcus aureus: NEGATIVE

## 2021-02-12 LAB — TYPE AND SCREEN
ABO/RH(D): A NEG
Antibody Screen: NEGATIVE

## 2021-02-12 LAB — PROTIME-INR
INR: 1 (ref 0.8–1.2)
Prothrombin Time: 13.2 seconds (ref 11.4–15.2)

## 2021-02-12 NOTE — Progress Notes (Signed)
COVID test- NA  PCP - Dr. Anne Shutter Cardiothoracic- Dr. Ellison Hughs LOV 08/13/20  Chest x-ray - Chest CT 08/13/20 EKG - no Stress Test - no ECHO - no Cardiac Cath - no Pacemaker/ICD device last checked:NA  Sleep Study - no CPAP -   Fasting Blood Sugar - NA Checks Blood Sugar _____ times a day  Blood Thinner Instructions:NA Aspirin Instructions: Last Dose:  Anesthesia review: no  Patient denies shortness of breath, fever, cough and chest pain at PAT appointment  Pt has no SOB with any activities. Pt has aortic enlargement that is being monitored.   Patient verbalized understanding of instructions that were given to them at the PAT appointment. Patient was also instructed that they will need to review over the PAT instructions again at home before surgery. yes

## 2021-02-16 NOTE — Anesthesia Preprocedure Evaluation (Addendum)
Anesthesia Evaluation  Patient identified by MRN, date of birth, ID band Patient awake    Reviewed: Allergy & Precautions, NPO status , Patient's Chart, lab work & pertinent test results  History of Anesthesia Complications Negative for: history of anesthetic complications  Airway Mallampati: II  TM Distance: >3 FB Neck ROM: Full    Dental  (+) Dental Advisory Given, Teeth Intact   Pulmonary neg pulmonary ROS,    Pulmonary exam normal        Cardiovascular Normal cardiovascular exam   Hx TAA    Neuro/Psych  Neuromuscular disease negative psych ROS   GI/Hepatic negative GI ROS, Neg liver ROS,   Endo/Other   Obesity   Renal/GU negative Renal ROS     Musculoskeletal negative musculoskeletal ROS (+)   Abdominal   Peds  Hematology negative hematology ROS (+)   Anesthesia Other Findings   Reproductive/Obstetrics                            Anesthesia Physical Anesthesia Plan  ASA: 2  Anesthesia Plan: Spinal   Post-op Pain Management:  Regional for Post-op pain   Induction:   PONV Risk Score and Plan: 1 and Treatment may vary due to age or medical condition and Propofol infusion  Airway Management Planned: Natural Airway and Simple Face Mask  Additional Equipment: None  Intra-op Plan:   Post-operative Plan:   Informed Consent: I have reviewed the patients History and Physical, chart, labs and discussed the procedure including the risks, benefits and alternatives for the proposed anesthesia with the patient or authorized representative who has indicated his/her understanding and acceptance.       Plan Discussed with: CRNA and Anesthesiologist  Anesthesia Plan Comments: (Labs reviewed, platelets acceptable. Discussed risks and benefits of spinal, including spinal/epidural hematoma, infection, failed block, and PDPH. Patient expressed understanding and wished to proceed. )       Anesthesia Quick Evaluation

## 2021-02-16 NOTE — Progress Notes (Signed)
Anesthesia Chart Review   Case: [id number] Date/Time: 02/25/21 0815   Procedure: COMPUTER ASSISTED TOTAL KNEE ARTHROPLASTY (Left: Knee)   Anesthesia type: Spinal   Pre-op diagnosis: Left knee osteoarthritis   Location: [facility] ROOM 07 / [facility]   Surgeons: Rod Can, MD       DISCUSSION:60 y.o. never smoker with h/o AAA, left knee OA scheduled for above procedure 02/25/2021 with Dr. Rod Can.   Pt last seen by cardiothoracic surgery 08/13/2020. 4.3 cm AAA stable, 1 year follow up recommended.   Anticipate pt can proceed with planned procedure barring acute status change.   VS: BP 134/80   Pulse 74   Temp 36.7 C (Oral)   Resp 18   Ht 5\' 11"  (1.803 m)   Wt 98.2 kg   SpO2 97%   BMI 30.20 kg/m   PROVIDERS: Orpah Melter, MD is PCP   Gilford Raid, MD is Cardiothoracic Surgeon LABS: Labs reviewed: Acceptable for surgery. (all labs ordered are listed, but only abnormal results are displayed)  Labs Reviewed  COMPREHENSIVE METABOLIC PANEL - Abnormal; Notable for the following components:      Result Value   Glucose, Bld 103 (*)    All other components within normal limits  SURGICAL PCR SCREEN  CBC  PROTIME-INR  TYPE AND SCREEN     IMAGES: CT Chest 08/13/2020 IMPRESSION: 1. Interval resolution of dominant part solid nodule in the right upper lobe and additional patchy ground-glass opacities in both lungs. No suspicious pulmonary nodules. 2. Interval development of several left-sided rib fractures posteriorly, including mildly displaced fractures of the left 6th and 7th ribs. These fractures appear subacute. 3. Stable dilatation of the ascending aorta having a maximal diameter of 4.3 cm. Mild Aortic Atherosclerosis (ICD10-I70.0).  EKG:   CV:  Past Medical History:  Diagnosis Date   Allergic rhinitis    Ascending aorta enlargement (HCC)    Joint pain    Sciatica    Thoracic aortic aneurysm without mention of rupture    Tubular adenoma    x 3    Past  Surgical History:  Procedure Laterality Date   CARPAL TUNNEL RELEASE     LEFT HAND   COLONOSCOPY     07/2010   HERNIA REPAIR     KNEE ARTHROSCOPY Left    1977   MEDIAL COLLATERAL LIGAMENT REPAIR, KNEE Left 1978   with pin   VASECTOMY      MEDICATIONS:  acetaminophen (TYLENOL) 500 MG tablet   Ascorbic Acid (VITAMIN C PO)   Cyanocobalamin (VITAMIN B-12 PO)   No current facility-administered medications for this encounter.    Konrad Felix Ward, PA-C [facility] [phone]

## 2021-02-25 ENCOUNTER — Encounter (HOSPITAL_COMMUNITY)
Admission: RE | Disposition: A | Discharge status: Home / Self Care | Payer: Self-pay | Source: Ambulatory Visit | Attending: Orthopedic Surgery

## 2021-02-25 ENCOUNTER — Ambulatory Visit (HOSPITAL_COMMUNITY): Payer: 59

## 2021-02-25 ENCOUNTER — Ambulatory Visit (HOSPITAL_COMMUNITY): Payer: 59 | Admitting: Anesthesiology

## 2021-02-25 ENCOUNTER — Ambulatory Visit (HOSPITAL_COMMUNITY): Payer: 59 | Admitting: Physician Assistant

## 2021-02-25 ENCOUNTER — Ambulatory Visit (HOSPITAL_COMMUNITY)
Admission: RE | Admit: 2021-02-25 | Discharge: 2021-02-25 | Disposition: A | Discharge status: Home / Self Care | Payer: 59 | Source: Ambulatory Visit | Attending: Orthopedic Surgery | Admitting: Orthopedic Surgery

## 2021-02-25 ENCOUNTER — Encounter (HOSPITAL_COMMUNITY): Payer: Self-pay | Admitting: Orthopedic Surgery

## 2021-02-25 DIAGNOSIS — G709 Myoneural disorder, unspecified: Secondary | ICD-10-CM | POA: Insufficient documentation

## 2021-02-25 DIAGNOSIS — E669 Obesity, unspecified: Secondary | ICD-10-CM | POA: Insufficient documentation

## 2021-02-25 DIAGNOSIS — Z96652 Presence of left artificial knee joint: Secondary | ICD-10-CM

## 2021-02-25 DIAGNOSIS — M1712 Unilateral primary osteoarthritis, left knee: Secondary | ICD-10-CM

## 2021-02-25 DIAGNOSIS — M1732 Unilateral post-traumatic osteoarthritis, left knee: Secondary | ICD-10-CM | POA: Diagnosis present

## 2021-02-25 DIAGNOSIS — Z6830 Body mass index (BMI) 30.0-30.9, adult: Secondary | ICD-10-CM | POA: Insufficient documentation

## 2021-02-25 HISTORY — PX: KNEE ARTHROPLASTY: SHX992

## 2021-02-25 LAB — ABO/RH: ABO/RH(D): A NEG

## 2021-02-25 SURGERY — ARTHROPLASTY, KNEE, TOTAL, USING IMAGELESS COMPUTER-ASSISTED NAVIGATION
Anesthesia: Spinal | Site: Knee | Laterality: Left

## 2021-02-25 MED ORDER — FENTANYL CITRATE PF 50 MCG/ML IJ SOSY: 25.0000 ug | PREFILLED_SYRINGE | INTRAMUSCULAR | Status: DC | PRN

## 2021-02-25 MED ORDER — METOCLOPRAMIDE HCL 5 MG PO TABS
5.0000 mg | ORAL_TABLET | Freq: Three times a day (TID) | ORAL | Status: DC | PRN
  Filled 2021-02-25: qty 2

## 2021-02-25 MED ORDER — LACTATED RINGERS IV BOLUS
250.0000 mL | Freq: Once | INTRAVENOUS | Status: AC
  Administered 2021-02-25: 250 mL via INTRAVENOUS

## 2021-02-25 MED ORDER — PROMETHAZINE HCL 25 MG/ML IJ SOLN: 6.2500 mg | INTRAMUSCULAR | Status: DC | PRN

## 2021-02-25 MED ORDER — MIDAZOLAM HCL 2 MG/2ML IJ SOLN
INTRAMUSCULAR | Status: AC
  Filled 2021-02-25: qty 2

## 2021-02-25 MED ORDER — KETOROLAC TROMETHAMINE 15 MG/ML IJ SOLN: 15.0000 mg | Freq: Four times a day (QID) | INTRAMUSCULAR | Status: DC

## 2021-02-25 MED ORDER — SODIUM CHLORIDE 0.9% FLUSH
INTRAVENOUS | Status: DC | PRN
  Administered 2021-02-25: 30 mL

## 2021-02-25 MED ORDER — BUPIVACAINE IN DEXTROSE 0.75-8.25 % IT SOLN
INTRATHECAL | Status: DC | PRN
  Administered 2021-02-25: 2 mL via INTRATHECAL

## 2021-02-25 MED ORDER — PROPOFOL 10 MG/ML IV BOLUS
INTRAVENOUS | Status: AC
  Filled 2021-02-25: qty 20

## 2021-02-25 MED ORDER — OXYCODONE HCL ER 10 MG PO T12A: 10.0000 mg | EXTENDED_RELEASE_TABLET | ORAL | 0 refills | Status: DC

## 2021-02-25 MED ORDER — PHENYLEPHRINE HCL (PRESSORS) 10 MG/ML IV SOLN
INTRAVENOUS | Status: AC
  Filled 2021-02-25: qty 1

## 2021-02-25 MED ORDER — SENNA 8.6 MG PO TABS: 2.0000 | ORAL_TABLET | Freq: Every day | ORAL | 1 refills | Status: AC

## 2021-02-25 MED ORDER — PROPOFOL 500 MG/50ML IV EMUL
INTRAVENOUS | Status: DC | PRN
  Administered 2021-02-25: 125 ug/kg/min via INTRAVENOUS

## 2021-02-25 MED ORDER — SODIUM CHLORIDE (PF) 0.9 % IJ SOLN
INTRAMUSCULAR | Status: AC
  Filled 2021-02-25: qty 30

## 2021-02-25 MED ORDER — PROPOFOL 10 MG/ML IV BOLUS
INTRAVENOUS | Status: DC | PRN
  Administered 2021-02-25 (×2): 20 mg via INTRAVENOUS

## 2021-02-25 MED ORDER — FENTANYL CITRATE (PF) 100 MCG/2ML IJ SOLN
INTRAMUSCULAR | Status: AC
  Filled 2021-02-25: qty 2

## 2021-02-25 MED ORDER — PROPOFOL 1000 MG/100ML IV EMUL
INTRAVENOUS | Status: AC
  Filled 2021-02-25: qty 200

## 2021-02-25 MED ORDER — STERILE WATER FOR IRRIGATION IR SOLN
Status: DC | PRN
  Administered 2021-02-25 (×2): 1000 mL

## 2021-02-25 MED ORDER — EPHEDRINE 5 MG/ML INJ
INTRAVENOUS | Status: AC
  Filled 2021-02-25: qty 5

## 2021-02-25 MED ORDER — SODIUM CHLORIDE 0.9 % IR SOLN
Status: DC | PRN
  Administered 2021-02-25 (×2): 1000 mL

## 2021-02-25 MED ORDER — KETOROLAC TROMETHAMINE 30 MG/ML IJ SOLN
INTRAMUSCULAR | Status: AC
  Filled 2021-02-25: qty 1

## 2021-02-25 MED ORDER — BUPIVACAINE-EPINEPHRINE (PF) 0.5% -1:200000 IJ SOLN
INTRAMUSCULAR | Status: DC | PRN
  Administered 2021-02-25: 30 mL via PERINEURAL

## 2021-02-25 MED ORDER — ONDANSETRON HCL 4 MG PO TABS: 4.0000 mg | ORAL_TABLET | Freq: Three times a day (TID) | ORAL | 0 refills | Status: DC | PRN

## 2021-02-25 MED ORDER — BUPIVACAINE-EPINEPHRINE (PF) 0.25% -1:200000 IJ SOLN
INTRAMUSCULAR | Status: AC
  Filled 2021-02-25: qty 30

## 2021-02-25 MED ORDER — DEXAMETHASONE SODIUM PHOSPHATE 10 MG/ML IJ SOLN
INTRAMUSCULAR | Status: DC | PRN
  Administered 2021-02-25: 10 mg via INTRAVENOUS

## 2021-02-25 MED ORDER — OXYCODONE HCL 5 MG PO TABS: 10.0000 mg | ORAL_TABLET | ORAL | Status: DC | PRN

## 2021-02-25 MED ORDER — METHOCARBAMOL 500 MG IVPB - SIMPLE MED: 500.0000 mg | Freq: Four times a day (QID) | INTRAVENOUS | Status: DC | PRN

## 2021-02-25 MED ORDER — KETOROLAC TROMETHAMINE 30 MG/ML IJ SOLN
INTRAMUSCULAR | Status: DC | PRN
  Administered 2021-02-25: 30 mg via INTRAVENOUS

## 2021-02-25 MED ORDER — OXYCODONE HCL 5 MG PO TABS: 5.0000 mg | ORAL_TABLET | ORAL | Status: DC | PRN

## 2021-02-25 MED ORDER — ASPIRIN 81 MG PO CHEW: 81.0000 mg | CHEWABLE_TABLET | Freq: Two times a day (BID) | ORAL | 0 refills | Status: AC

## 2021-02-25 MED ORDER — PHENYLEPHRINE HCL-NACL 20-0.9 MG/250ML-% IV SOLN
INTRAVENOUS | Status: DC | PRN
  Administered 2021-02-25: 50 ug/min via INTRAVENOUS

## 2021-02-25 MED ORDER — ISOPROPYL ALCOHOL 70 % SOLN
Status: DC | PRN
  Administered 2021-02-25: 1 via TOPICAL

## 2021-02-25 MED ORDER — ONDANSETRON HCL 4 MG/2ML IJ SOLN
INTRAMUSCULAR | Status: DC | PRN
  Administered 2021-02-25: 4 mg via INTRAVENOUS

## 2021-02-25 MED ORDER — CHLORHEXIDINE GLUCONATE 0.12 % MT SOLN
15.0000 mL | Freq: Once | OROMUCOSAL | Status: AC
  Administered 2021-02-25: 15 mL via OROMUCOSAL

## 2021-02-25 MED ORDER — TRANEXAMIC ACID-NACL 1000-0.7 MG/100ML-% IV SOLN
1000.0000 mg | INTRAVENOUS | Status: AC
  Administered 2021-02-25: 1000 mg via INTRAVENOUS
  Filled 2021-02-25: qty 100

## 2021-02-25 MED ORDER — METHOCARBAMOL 500 MG PO TABS: 500.0000 mg | ORAL_TABLET | Freq: Four times a day (QID) | ORAL | Status: DC | PRN

## 2021-02-25 MED ORDER — ONDANSETRON HCL 4 MG PO TABS
4.0000 mg | ORAL_TABLET | Freq: Four times a day (QID) | ORAL | Status: DC | PRN
  Filled 2021-02-25: qty 1

## 2021-02-25 MED ORDER — METOCLOPRAMIDE HCL 5 MG/ML IJ SOLN: 5.0000 mg | Freq: Three times a day (TID) | INTRAMUSCULAR | Status: DC | PRN

## 2021-02-25 MED ORDER — LACTATED RINGERS IV SOLN: INTRAVENOUS | Status: DC

## 2021-02-25 MED ORDER — LACTATED RINGERS IV BOLUS
500.0000 mL | Freq: Once | INTRAVENOUS | Status: AC
  Administered 2021-02-25: 500 mL via INTRAVENOUS

## 2021-02-25 MED ORDER — MELOXICAM 15 MG PO TABS: 15.0000 mg | ORAL_TABLET | Freq: Every day | ORAL | 3 refills | Status: DC

## 2021-02-25 MED ORDER — ACETAMINOPHEN 500 MG PO TABS: 1000.0000 mg | ORAL_TABLET | Freq: Once | ORAL | Status: DC

## 2021-02-25 MED ORDER — SODIUM CHLORIDE 0.9 % IV SOLN: INTRAVENOUS | Status: DC

## 2021-02-25 MED ORDER — OXYCODONE HCL 5 MG PO TABS: 5.0000 mg | ORAL_TABLET | Freq: Once | ORAL | Status: AC | PRN

## 2021-02-25 MED ORDER — IRRISEPT - 450ML BOTTLE WITH 0.05% CHG IN STERILE WATER, USP 99.95% OPTIME
TOPICAL | Status: DC | PRN
  Administered 2021-02-25: 450 mL

## 2021-02-25 MED ORDER — 0.9 % SODIUM CHLORIDE (POUR BTL) OPTIME
TOPICAL | Status: DC | PRN
  Administered 2021-02-25: 1000 mL

## 2021-02-25 MED ORDER — FENTANYL CITRATE (PF) 100 MCG/2ML IJ SOLN
INTRAMUSCULAR | Status: DC | PRN
  Administered 2021-02-25: 100 ug via INTRAVENOUS

## 2021-02-25 MED ORDER — ACETAMINOPHEN 325 MG PO TABS: 325.0000 mg | ORAL_TABLET | Freq: Four times a day (QID) | ORAL | Status: DC | PRN

## 2021-02-25 MED ORDER — ACETAMINOPHEN 10 MG/ML IV SOLN
1000.0000 mg | Freq: Once | INTRAVENOUS | Status: AC
  Administered 2021-02-25: 1000 mg via INTRAVENOUS
  Filled 2021-02-25: qty 100

## 2021-02-25 MED ORDER — OXYCODONE HCL 5 MG/5ML PO SOLN: 5.0000 mg | Freq: Once | ORAL | Status: AC | PRN

## 2021-02-25 MED ORDER — DOCUSATE SODIUM 100 MG PO CAPS: 100.0000 mg | ORAL_CAPSULE | Freq: Two times a day (BID) | ORAL | 1 refills | Status: AC

## 2021-02-25 MED ORDER — BUPIVACAINE-EPINEPHRINE 0.25% -1:200000 IJ SOLN
INTRAMUSCULAR | Status: DC | PRN
  Administered 2021-02-25: 30 mL

## 2021-02-25 MED ORDER — EPHEDRINE SULFATE-NACL 50-0.9 MG/10ML-% IV SOSY
PREFILLED_SYRINGE | INTRAVENOUS | Status: DC | PRN
  Administered 2021-02-25 (×2): 5 mg via INTRAVENOUS

## 2021-02-25 MED ORDER — MIDAZOLAM HCL 5 MG/5ML IJ SOLN
INTRAMUSCULAR | Status: DC | PRN
  Administered 2021-02-25: 2 mg via INTRAVENOUS

## 2021-02-25 MED ORDER — ORAL CARE MOUTH RINSE: 15.0000 mL | Freq: Once | OROMUCOSAL | Status: AC

## 2021-02-25 MED ORDER — CEFAZOLIN SODIUM-DEXTROSE 2-4 GM/100ML-% IV SOLN: 2.0000 g | Freq: Four times a day (QID) | INTRAVENOUS | Status: DC

## 2021-02-25 MED ORDER — CEFAZOLIN SODIUM-DEXTROSE 2-4 GM/100ML-% IV SOLN
2.0000 g | INTRAVENOUS | Status: AC
  Administered 2021-02-25: 2 g via INTRAVENOUS
  Filled 2021-02-25: qty 100

## 2021-02-25 MED ORDER — OXYCODONE HCL 5 MG PO TABS: 5.0000 mg | ORAL_TABLET | ORAL | 0 refills | Status: DC | PRN

## 2021-02-25 MED ORDER — HYDROMORPHONE HCL 1 MG/ML IJ SOLN: 0.5000 mg | INTRAMUSCULAR | Status: DC | PRN

## 2021-02-25 MED ORDER — POVIDONE-IODINE 10 % EX SWAB
2.0000 "application " | Freq: Once | CUTANEOUS | Status: AC
  Administered 2021-02-25: 2 via TOPICAL

## 2021-02-25 MED ORDER — PROPOFOL 1000 MG/100ML IV EMUL
INTRAVENOUS | Status: AC
  Filled 2021-02-25: qty 100

## 2021-02-25 MED ORDER — POVIDONE-IODINE 10 % EX SWAB: 2.0000 "application " | Freq: Once | CUTANEOUS | Status: DC

## 2021-02-25 MED ORDER — ONDANSETRON HCL 4 MG/2ML IJ SOLN: 4.0000 mg | Freq: Four times a day (QID) | INTRAMUSCULAR | Status: DC | PRN

## 2021-02-25 MED ORDER — PHENYLEPHRINE 40 MCG/ML (10ML) SYRINGE FOR IV PUSH (FOR BLOOD PRESSURE SUPPORT)
PREFILLED_SYRINGE | INTRAVENOUS | Status: AC
  Filled 2021-02-25: qty 10

## 2021-02-25 MED ORDER — SODIUM CHLORIDE 0.9% IV SOLUTION: INTRAVENOUS | Status: DC | PRN

## 2021-02-25 MED ORDER — OXYCODONE HCL 5 MG PO TABS
ORAL_TABLET | ORAL | Status: AC
  Administered 2021-02-25: 5 mg via ORAL
  Filled 2021-02-25: qty 1

## 2021-02-25 SURGICAL SUPPLY — 77 items
ADH SKN CLS APL DERMABOND .7 (GAUZE/BANDAGES/DRESSINGS) ×2
APL PRP STRL LF DISP 70% ISPRP (MISCELLANEOUS) ×2
BAG COUNTER SPONGE SURGICOUNT (BAG) IMPLANT
BAG SPEC THK2 15X12 ZIP CLS (MISCELLANEOUS)
BAG SPNG CNTER NS LX DISP (BAG)
BAG ZIPLOCK 12X15 (MISCELLANEOUS) IMPLANT
BATTERY INSTRU NAVIGATION (MISCELLANEOUS) ×6 IMPLANT
BLADE SAW RECIPROCATING 77.5 (BLADE) ×2 IMPLANT
BNDG ELASTIC 4X5.8 VLCR STR LF (GAUZE/BANDAGES/DRESSINGS) ×2 IMPLANT
BNDG ELASTIC 6X5.8 VLCR STR LF (GAUZE/BANDAGES/DRESSINGS) ×2 IMPLANT
BSPLAT TIB 7 KN TRITANIUM (Knees) ×1 IMPLANT
BTRY SRG DRVR LF (MISCELLANEOUS) ×3
CHLORAPREP W/TINT 26 (MISCELLANEOUS) ×4 IMPLANT
COVER SURGICAL LIGHT HANDLE (MISCELLANEOUS) ×2 IMPLANT
CUFF TOURN SGL QUICK 34 (TOURNIQUET CUFF) ×2
CUFF TRNQT CYL 34X4.125X (TOURNIQUET CUFF) ×1 IMPLANT
DECANTER SPIKE VIAL GLASS SM (MISCELLANEOUS) ×4 IMPLANT
DERMABOND ADVANCED (GAUZE/BANDAGES/DRESSINGS) ×2
DERMABOND ADVANCED .7 DNX12 (GAUZE/BANDAGES/DRESSINGS) ×2 IMPLANT
DRAPE INCISE IOBAN 66X45 STRL (DRAPES) ×6 IMPLANT
DRAPE SHEET LG 3/4 BI-LAMINATE (DRAPES) ×6 IMPLANT
DRAPE U-SHAPE 47X51 STRL (DRAPES) ×2 IMPLANT
DRSG AQUACEL AG ADV 3.5X10 (GAUZE/BANDAGES/DRESSINGS) IMPLANT
DRSG AQUACEL AG ADV 3.5X14 (GAUZE/BANDAGES/DRESSINGS) ×2 IMPLANT
DRSG TEGADERM 4X4.75 (GAUZE/BANDAGES/DRESSINGS) IMPLANT
ELECT BLADE TIP CTD 4 INCH (ELECTRODE) ×2 IMPLANT
ELECT REM PT RETURN 15FT ADLT (MISCELLANEOUS) ×2 IMPLANT
EVACUATOR 1/8 PVC DRAIN (DRAIN) IMPLANT
FEMORAL POSTERIOR SZ7 LFT (Femur) ×1 IMPLANT
GAUZE SPONGE 4X4 12PLY STRL (GAUZE/BANDAGES/DRESSINGS) ×2 IMPLANT
GLOVE SRG 8 PF TXTR STRL LF DI (GLOVE) ×2 IMPLANT
GLOVE SURG ENC MOIS LTX SZ8.5 (GLOVE) ×4 IMPLANT
GLOVE SURG ENC TEXT LTX SZ7.5 (GLOVE) ×6 IMPLANT
GLOVE SURG UNDER POLY LF SZ8 (GLOVE) ×4
GLOVE SURG UNDER POLY LF SZ8.5 (GLOVE) ×2 IMPLANT
GOWN SPEC L3 XXLG W/TWL (GOWN DISPOSABLE) ×2 IMPLANT
GOWN SPEC L4 XLG W/TWL (GOWN DISPOSABLE) ×2 IMPLANT
HANDPIECE INTERPULSE COAX TIP (DISPOSABLE) ×2
HOLDER FOLEY CATH W/STRAP (MISCELLANEOUS) ×2 IMPLANT
HOOD PEEL AWAY FLYTE STAYCOOL (MISCELLANEOUS) ×6 IMPLANT
INSERT TIB BEAR PS X3 7 12 (Insert) ×2 IMPLANT
JET LAVAGE IRRISEPT WOUND (IRRIGATION / IRRIGATOR)
KIT TURNOVER KIT A (KITS) IMPLANT
KNEE TIBIAL COMPONENT SZ7 (Knees) ×2 IMPLANT
LAVAGE JET IRRISEPT WOUND (IRRIGATION / IRRIGATOR) IMPLANT
MARKER SKIN DUAL TIP RULER LAB (MISCELLANEOUS) ×2 IMPLANT
NDL SAFETY ECLIPSE 18X1.5 (NEEDLE) ×1 IMPLANT
NEEDLE HYPO 18GX1.5 SHARP (NEEDLE) ×2
NEEDLE SPNL 18GX3.5 QUINCKE PK (NEEDLE) ×2 IMPLANT
NS IRRIG 1000ML POUR BTL (IV SOLUTION) ×2 IMPLANT
PACK TOTAL KNEE CUSTOM (KITS) ×2 IMPLANT
PADDING CAST COTTON 6X4 STRL (CAST SUPPLIES) ×2 IMPLANT
PATELLA ASYMMETRIC 38X11 KNEE (Orthopedic Implant) ×2 IMPLANT
PIN FLUTED HEDLESS FIX 3.5X1/8 (PIN) ×2 IMPLANT
POSTERIOR FEMORAL SZ7 LFT (Femur) ×2 IMPLANT
PROTECTOR NERVE ULNAR (MISCELLANEOUS) ×2 IMPLANT
SAW OSC TIP CART 19.5X105X1.3 (SAW) ×2 IMPLANT
SEALER BIPOLAR AQUA 6.0 (INSTRUMENTS) ×2 IMPLANT
SET HNDPC FAN SPRY TIP SCT (DISPOSABLE) ×1 IMPLANT
SET PAD KNEE POSITIONER (MISCELLANEOUS) ×2 IMPLANT
SPONGE DRAIN TRACH 4X4 STRL 2S (GAUZE/BANDAGES/DRESSINGS) IMPLANT
SUT MNCRL AB 3-0 PS2 18 (SUTURE) ×2 IMPLANT
SUT MNCRL AB 4-0 PS2 18 (SUTURE) ×2 IMPLANT
SUT MON AB 2-0 CT1 36 (SUTURE) ×4 IMPLANT
SUT STRATAFIX PDO 1 14 VIOLET (SUTURE) ×4
SUT STRATFX PDO 1 14 VIOLET (SUTURE) ×2
SUT VIC AB 1 CTX 36 (SUTURE) ×4
SUT VIC AB 1 CTX36XBRD ANBCTR (SUTURE) ×2 IMPLANT
SUT VIC AB 2-0 CT1 27 (SUTURE) ×2
SUT VIC AB 2-0 CT1 TAPERPNT 27 (SUTURE) ×1 IMPLANT
SUTURE STRATFX PDO 1 14 VIOLET (SUTURE) ×2 IMPLANT
SYR 3ML LL SCALE MARK (SYRINGE) ×2 IMPLANT
TOWER CARTRIDGE SMART MIX (DISPOSABLE) IMPLANT
TRAY FOLEY MTR SLVR 16FR STAT (SET/KITS/TRAYS/PACK) IMPLANT
TUBE SUCTION HIGH CAP CLEAR NV (SUCTIONS) ×2 IMPLANT
WATER STERILE IRR 1000ML POUR (IV SOLUTION) ×4 IMPLANT
WRAP KNEE MAXI GEL POST OP (GAUZE/BANDAGES/DRESSINGS) ×2 IMPLANT

## 2021-02-25 NOTE — Transfer of Care (Signed)
Immediate Anesthesia Transfer of Care Note  Patient: Dennis Le  Procedure(s) Performed: COMPUTER ASSISTED TOTAL KNEE ARTHROPLASTY (Left: Knee)  Patient Location: PACU  Anesthesia Type:Spinal  Level of Consciousness: awake, alert , oriented and patient cooperative  Airway & Oxygen Therapy: Patient Spontanous Breathing and Patient connected to nasal cannula oxygen  Post-op Assessment: Report given to RN and Post -op Vital signs reviewed and stable  Post vital signs: Reviewed and stable  Last Vitals:  Vitals Value Taken Time  BP 130/69 02/25/21 1149  Temp    Pulse 81 02/25/21 1153  Resp 14 02/25/21 1153  SpO2 95 % 02/25/21 1153  Vitals shown include unvalidated device data.  Last Pain:  Vitals:   02/25/21 0657  TempSrc: Oral  PainSc:          Complications: No notable events documented.

## 2021-02-25 NOTE — Anesthesia Procedure Notes (Signed)
Anesthesia Regional Block: Adductor canal block   Pre-Anesthetic Checklist: , timeout performed,  Correct Patient, Correct Site, Correct Laterality,  Correct Procedure, Correct Position, site marked,  Risks and benefits discussed,  Surgical consent,  Pre-op evaluation,  At surgeon's request and post-op pain management  Laterality: Left  Prep: chloraprep       Needles:  Injection technique: Single-shot  Needle Type: Echogenic Needle     Needle Length: 10cm  Needle Gauge: 21     Additional Needles:   Narrative:  Start time: 02/25/2021 8:02 AM End time: 02/25/2021 8:05 AM Injection made incrementally with aspirations every 5 mL.  Performed by: Personally  Anesthesiologist: Audry Pili, MD  Additional Notes: No pain on injection. No increased resistance to injection. Injection made in 5cc increments. Good needle visualization. Patient tolerated the procedure well.

## 2021-02-25 NOTE — Anesthesia Postprocedure Evaluation (Signed)
Anesthesia Post Note  Patient: Dennis Le  Procedure(s) Performed: COMPUTER ASSISTED TOTAL KNEE ARTHROPLASTY (Left: Knee)     Patient location during evaluation: PACU Anesthesia Type: Spinal Level of consciousness: awake and alert Pain management: pain level controlled Vital Signs Assessment: post-procedure vital signs reviewed and stable Respiratory status: spontaneous breathing and respiratory function stable Cardiovascular status: blood pressure returned to baseline and stable Postop Assessment: spinal receding and no apparent nausea or vomiting Anesthetic complications: no   No notable events documented.  Last Vitals:  Vitals:   02/25/21 1303 02/25/21 1315  BP: 121/84   Pulse: 84 80  Resp: 20 20  Temp: 36.6 C   SpO2: 94% 92%    Last Pain:  Vitals:   02/25/21 1315  TempSrc:   PainSc: 0-No pain                 Audry Pili

## 2021-02-25 NOTE — Discharge Instructions (Signed)
 Dr. Trigo Winterbottom Total Joint Specialist [facility] [address] [place], [place] [address] [phone]  TOTAL KNEE REPLACEMENT POSTOPERATIVE DIRECTIONS    Knee Rehabilitation, Guidelines Following Surgery  Results after knee surgery are often greatly improved when you follow the exercise, range of motion and muscle strengthening exercises prescribed by your doctor. Safety measures are also important to protect the knee from further injury. Any time any of these exercises cause you to have increased pain or swelling in your knee joint, decrease the amount until you are comfortable again and slowly increase them. If you have problems or questions, call your caregiver or physical therapist for advice.   WEIGHT BEARING Weight bearing as tolerated with assist device (walker, cane, etc) as directed, use it as long as suggested by your surgeon or therapist, typically at least 4-6 weeks.  HOME CARE INSTRUCTIONS  Remove items at home which could result in a fall. This includes throw rugs or furniture in walking pathways.  Continue medications as instructed at time of discharge. You may have some home medications which will be placed on hold until you complete the course of blood thinner medication.  You may start showering once you are discharged home but do not submerge the incision under water. Just pat the incision dry and apply a dry gauze dressing on daily. Walk with walker as instructed.  You may resume a sexual relationship in one month or when given the OK by your doctor.  Use walker as long as suggested by your caregivers. Avoid periods of inactivity such as sitting longer than an hour when not asleep. This helps prevent blood clots.  You may put full weight on your legs and walk as much as is comfortable.  You may return to work once you are cleared by your doctor.  Do not drive a car for 6 weeks or until released by you surgeon.  Do not drive while  taking narcotics.  Wear the elastic stockings for three weeks following surgery during the day but you may remove then at night. Make sure you keep all of your appointments after your operation with all of your doctors and caregivers. You should call the office at the above phone number and make an appointment for approximately two weeks after the date of your surgery. Do not remove your surgical dressing. The dressing is waterproof; you may take showers in 3 days, but do not take tub baths or submerge the dressing. Please pick up a stool softener and laxative for home use as long as you are requiring pain medications. ICE to the affected knee every three hours for 30 minutes at a time and then as needed for pain and swelling.  Continue to use ice on the knee for pain and swelling from surgery. You may notice swelling that will progress down to the foot and ankle.  This is normal after surgery.  Elevate the leg when you are not up walking on it.   It is important for you to complete the blood thinner medication as prescribed by your doctor. Continue to use the breathing machine which will help keep your temperature down.  It is common for your temperature to cycle up and down following surgery, especially at night when you are not up moving around and exerting yourself.  The breathing machine keeps your lungs expanded and your temperature down.  RANGE OF MOTION AND STRENGTHENING EXERCISES  Rehabilitation of the knee is important following a knee injury or an   operation. After just a few days of immobilization, the muscles of the thigh which control the knee become weakened and shrink (atrophy). Knee exercises are designed to build up the tone and strength of the thigh muscles and to improve knee motion. Often times heat used for twenty to thirty minutes before working out will loosen up your tissues and help with improving the range of motion but do not use heat for the first two weeks following surgery.  These exercises can be done on a training (exercise) mat, on the floor, on a table or on a bed. Use what ever works the best and is most comfortable for you Knee exercises include:  Leg Lifts - While your knee is still immobilized in a splint or cast, you can do straight leg raises. Lift the leg to 60 degrees, hold for 3 sec, and slowly lower the leg. Repeat 10-20 times 2-3 times daily. Perform this exercise against resistance later as your knee gets better.  Quad and Hamstring Sets - Tighten up the muscle on the front of the thigh (Quad) and hold for 5-10 sec. Repeat this 10-20 times hourly. Hamstring sets are done by pushing the foot backward against an object and holding for 5-10 sec. Repeat as with quad sets.  A rehabilitation program following serious knee injuries can speed recovery and prevent re-injury in the future due to weakened muscles. Contact your doctor or a physical therapist for more information on knee rehabilitation.   POST-OPERATIVE OPIOID TAPER INSTRUCTIONS: It is important to wean off of your opioid medication as soon as possible. If you do not need pain medication after your surgery it is ok to stop day one. Opioids include: Codeine, Hydrocodone(Norco, Vicodin), Oxycodone(Percocet, oxycontin) and hydromorphone amongst others.  Long term and even short term use of opiods can cause: Increased pain response Dependence Constipation Depression Respiratory depression And more.  Withdrawal symptoms can include Flu like symptoms Nausea, vomiting And more Techniques to manage these symptoms Hydrate well Eat regular healthy meals Stay active Use relaxation techniques(deep breathing, meditating, yoga) Do Not substitute Alcohol to help with tapering If you have been on opioids for less than two weeks and do not have pain than it is ok to stop all together.  Plan to wean off of opioids This plan should start within one week post op of your joint replacement. Maintain the same  interval or time between taking each dose and first decrease the dose.  Cut the total daily intake of opioids by one tablet each day Next start to increase the time between doses. The last dose that should be eliminated is the evening dose.    SKILLED REHAB INSTRUCTIONS: If the patient is transferred to a skilled rehab facility following release from the hospital, a list of the current medications will be sent to the facility for the patient to continue.  When discharged from the skilled rehab facility, please have the facility set up the patient's Home Health Physical Therapy prior to being released. Also, the skilled facility will be responsible for providing the patient with their medications at time of release from the facility to include their pain medication, the muscle relaxants, and their blood thinner medication. If the patient is still at the rehab facility at time of the two week follow up appointment, the skilled rehab facility will also need to assist the patient in arranging follow up appointment in our office and any transportation needs.  MAKE SURE YOU:  Understand these instructions.  Will watch   your condition.  Will get help right away if you are not doing well or get worse.    Pick up stool softner and laxative for home use following surgery while on pain medications. Do NOT remove your dressing. You may shower.  Do not take tub baths or submerge incision under water. May shower starting three days after surgery. Please use a clean towel to pat the incision dry following showers. Continue to use ice for pain and swelling after surgery. Do not use any lotions or creams on the incision until instructed by your surgeon.  

## 2021-02-25 NOTE — Evaluation (Signed)
Physical Therapy Evaluation Patient Details Name: Dennis Le MRN: [id number] DOB: [birth year] Today's Date: 02/25/2021  History of Present Illness  Pt is 60 yo male s/p L TKA on 02/25/21.  Pt with hx of arthritis and L THA 12/26/2020.  Clinical Impression  Pt is s/p TKA resulting in the deficits listed below (see PT Problem List). Pt seen in PACU for possible same day d/c.  At baseline, pt is very active and independent, he has DME and family support.  During evaluation, pt was able to ambulate with min guard and performed stairs to simulate home environment.  Did note some overstepping during stair training and pt then noting buttock still a little numb. Pt was still able to complete stairs, exercises, and ambulation safely with min guard.  From PT perspective , safe for same day d/c but if remain hospitalized would benefit from further acute PT to progress. Did speak with RN and advised letting pt stay longer until numbness resolved.          Recommendations for follow up therapy are one component of a multi-disciplinary discharge planning process, led by the attending physician.  Recommendations may be updated based on patient status, additional functional criteria and insurance authorization.  Follow Up Recommendations Follow physician's recommendations for discharge plan and follow up therapies    Assistance Recommended at Discharge Intermittent Supervision/Assistance  Functional Status Assessment Patient has had a recent decline in their functional status and demonstrates the ability to make significant improvements in function in a reasonable and predictable amount of time.  Equipment Recommendations  None recommended by PT    Recommendations for Other Services       Precautions / Restrictions Precautions Precautions: Fall Restrictions Weight Bearing Restrictions: Yes LLE Weight Bearing: Weight bearing as tolerated      Mobility  Bed Mobility Overal bed mobility: Needs  Assistance Bed Mobility: Supine to Sit;Sit to Supine     Supine to sit: Supervision Sit to supine: Supervision        Transfers Overall transfer level: Needs assistance Equipment used: Rolling walker (2 wheels) Transfers: Sit to/from Stand Sit to Stand: Min guard           General transfer comment: cues for hand placement and L LE management    Ambulation/Gait Ambulation/Gait assistance: Min guard Gait Distance (Feet): 80 Feet Assistive device: Rolling walker (2 wheels) Gait Pattern/deviations: Step-to pattern;Decreased stride length Gait velocity: decreased     General Gait Details: Cues for sequence and RW proximity; steady gait with small steps  Stairs Stairs: Yes Stairs assistance: Min guard     General stair comments: Started wtih 3 steps backwards and min guard - pt had instance where he placed RW too high with mild LOB requiring min A but was able to continue with cues.  Pt stated after his hip he went with HHA on R and placed RW sideways on stairs that worked well.  Tried but stairs too narrow.  Then performed 5 steps wtih 1 rail and HHA and min guard. Did note pt overstepping with R leg - asked about numbness and reports that buttock does feel numb; otherwise performed without difficulty.  Advised either cane and HHA or RW/sideways and HHA  Wheelchair Mobility    Modified Rankin (Stroke Patients Only)       Balance Overall balance assessment: Needs assistance   Sitting balance-Leahy Scale: Normal     Standing balance support: Bilateral upper extremity supported;No upper extremity supported Standing balance-Leahy Scale: [place] Standing  balance comment: Static no AD; RW to ambulate                             Pertinent Vitals/Pain Pain Assessment: No/denies pain    Home Living Family/patient expects to be discharged to:: Private residence Living Arrangements: Spouse/significant other Available Help at Discharge: Family;Available 24  hours/day Type of Home: House Home Access: Stairs to enter Entrance Stairs-Rails: None Entrance Stairs-Number of Steps: 3   Home Layout: One level Home Equipment: Tub bench;Animator (2 wheels)      Prior Function Prior Level of Function : Independent/Modified Independent             Mobility Comments: Working; ambulates in Tree surgeon        Extremity/Trunk Assessment   Upper Extremity Assessment Upper Extremity Assessment: Overall WFL for tasks assessed    Lower Extremity Assessment Lower Extremity Assessment: RLE deficits/detail;LLE deficits/detail RLE Deficits / Details: ROM WFL; MMT 5/5 RLE Sensation:  (initially reports no numbness ; however, noted overstepping on stairs and pt then reports buttock still somewhat numb) LLE Deficits / Details: Expected post op changes; ROM : ankle and hip WFL, knee 0 to 90 degrees; MMT : ankle 5/5, hip and knee 3/5 LLE Sensation:  (initially reports no numbness ; however, noted overstepping on stairs and pt then reports buttock still somewhat numb)    Cervical / Trunk Assessment Cervical / Trunk Assessment: Normal  Communication      Cognition Arousal/Alertness: Awake/alert Behavior During Therapy: WFL for tasks assessed/performed Overall Cognitive Status: Within Functional Limits for tasks assessed                                          General Comments General comments (skin integrity, edema, etc.): VSS Educated on safe ice use, no pivots, car transfers, resting with leg straight. Also, encouraged walking every 1-2 hours during day. Educated on HEP with focus on mobility the first weeks. Discussed doing exercises within pain control and if pain increasing could decreased ROM, reps, and stop exercises as needed. Encouraged to perform quad sets and ankle pumps frequently for blood flow and to promote full knee extension.     Exercises Total Joint Exercises Ankle  Circles/Pumps: AROM;5 reps;Both;Supine Quad Sets: AROM;Both;5 reps;Supine Towel Squeeze: AROM;Both;5 reps;Supine Heel Slides: AROM;Left;5 reps;Supine Hip ABduction/ADduction: AROM;Left;5 reps;Supine Long Arc Quad: AROM;Left;5 reps;Seated Knee Flexion: AROM;Left;5 reps;Seated Goniometric ROM: L knee 0 to 90 degrees   Assessment/Plan    PT Assessment Patient needs continued PT services  PT Problem List Decreased strength;Decreased mobility;Decreased safety awareness;Decreased range of motion;Decreased activity tolerance;Decreased balance;Decreased knowledge of use of DME;Pain       PT Treatment Interventions DME instruction;Therapeutic activities;Modalities;Gait training;Therapeutic exercise;Patient/family education;Stair training;Balance training;Functional mobility training    PT Goals (Current goals can be found in the Care Plan section)  Acute Rehab PT Goals Patient Stated Goal: return home PT Goal Formulation: With patient/family Time For Goal Achievement: 03/11/21 Potential to Achieve Goals: Good    Frequency 7X/week   Barriers to discharge        Co-evaluation               AM-PAC PT "6 Clicks" Mobility  Outcome Measure Help needed turning from your back to your side while in a flat bed without using bedrails?:  A Little Help needed moving from lying on your back to sitting on the side of a flat bed without using bedrails?: A Little Help needed moving to and from a bed to a chair (including a wheelchair)?: A Little Help needed standing up from a chair using your arms (e.g., wheelchair or bedside chair)?: A Little Help needed to walk in hospital room?: A Little Help needed climbing 3-5 steps with a railing? : A Little 6 Click Score: 18    End of Session Equipment Utilized During Treatment: Gait belt Activity Tolerance: Patient tolerated treatment well Patient left: in bed;with call bell/phone within reach;with family/visitor present Nurse Communication:  Mobility status PT Visit Diagnosis: Other abnormalities of gait and mobility (R26.89);Muscle weakness (generalized) (M62.81)    Time: 1339-1410 PT Time Calculation (min) (ACUTE ONLY): 31 min   Charges:   PT Evaluation $PT Eval Low Complexity: 1 Low PT Treatments $Gait Training: 8-22 mins        Abran Richard, PT Acute Rehab Services Pager [phone] [facility] 02/25/2021, 2:42 PM

## 2021-02-25 NOTE — Interval H&P Note (Signed)
History and Physical Interval Note:  02/25/2021 8:32 AM  Dennis Le  has presented today for surgery, with the diagnosis of Left knee osteoarthritis.  The various methods of treatment have been discussed with the patient and family. After consideration of risks, benefits and other options for treatment, the patient has consented to  Procedure(s): COMPUTER ASSISTED TOTAL KNEE ARTHROPLASTY (Left) as a surgical intervention.  The patient's history has been reviewed, patient examined, no change in status, stable for surgery.  I have reviewed the patient's chart and labs.  Questions were answered to the patient's satisfaction.     [name]

## 2021-02-25 NOTE — Op Note (Addendum)
OPERATIVE REPORT  SURGEON: Rod Can, MD   ASSISTANT: Cherlynn June, PA-C  PREOPERATIVE DIAGNOSIS: Post-traumatic Left knee arthritis.   POSTOPERATIVE DIAGNOSIS: Post-traumatic Left knee arthritis.   PROCEDURE: Left total knee arthroplasty.   IMPLANTS: Stryker Triathlon PS femur, size 7. Stryker Tritanium tibia, size 7. X3 polyethelyene insert, size 12 mm, PS. 3 button asymmetric patella, size 38 mm.  ANESTHESIA:  MAC, Regional, and Spinal  TOURNIQUET TIME: Not utilized.   ESTIMATED BLOOD LOSS:-350 mL    ANTIBIOTICS: 2g Ancef.  DRAINS: None.  COMPLICATIONS: None   CONDITION: PACU - hemodynamically stable.   BRIEF CLINICAL NOTE: Dennis Le is a 60 y.o. male with a long-standing history of post-traumatic Left knee arthritis. He has remote history of open multiligamentous reconstruction. After failing conservative management, the patient was indicated for total knee arthroplasty. The risks, benefits, and alternatives to the procedure were explained, and the patient elected to proceed.  PROCEDURE IN DETAIL: Adductor canal block was obtained in the pre-op holding area. Once inside the operative room, spinal anesthesia was obtained, and a foley catheter was inserted. The patient was then positioned and the lower extremity was prepped and draped in the normal sterile surgical fashion.  A tourniquet was not utilized. A time-out was called verifying side and site of surgery. The patient received IV antibiotics within 60 minutes of beginning the procedure.   There was an oblique incision over the anteromedial aspect of the distal femur compatible with MCL reconstruction. There was a second incision over the lateral femur / IT band. I marked a new incision taking care to leave adequate skin bridges. An anterior approach to the knee was performed utilizing a midvastus arthrotomy. A medial release was performed and the patellar fat pad was excised. Large medial compartment  osteophytes were identified and removed. The notch was fully encased with osteophytes, which I removed with an osteotome. Stryker imageless navigation was used to cut the distal femur perpendicular to the mechanical axis. A freehand patellar resection was performed, and the patella was sized and prepared with 3 lug holes.  Nagivation was used to make a neutral proximal tibia resection, taking 2 mm of bone from the medial side with 3 degrees of slope. The menisci were excised. A spacer block was placed, and the alignment and balance in extension were confirmed.   The distal femur was sized using the 3-degree external rotation guide referencing the posterior femoral cortex. The appropriate 4-in-1 cutting block was pinned into place. Rotation was checked using Whiteside's line, the epicondylar axis, and then confirmed with a spacer block in flexion. The remaining femoral cuts were performed, taking care to protect the MCL. There were large osteophytes in the posterior compartment of the knee, which I removed with a curved osteotome.  The tibia was sized and the trial tray was pinned into place. The remaining trail components were inserted. The knee was taken through a range of motion. The PCL was attenuated severely and the tibia had a propensity to migrate in the coronal plane with range of motion. At this point, I placed a box cutting guide, and the distal femur was prepared for a PS implant. Trial components were inserted. The knee was stable to varus and valgus stress through a full range of motion. The patella tracked centrally. The trial components were removed, and the proximal tibial surface was prepared. Final components were impacted into place. The knee was tested for a final time and found to be well balanced.   The wound  was copiously irrigated with Irrisept solution and normal saline using pule lavage.  Marcaine solution was injected into the periarticular soft tissue.  The wound was closed in  layers using #1 Vicryl and Stratafix for the fascia, 2-0 Monocryl for the deep dermal layer, and skin staples. Dermabond was applied to the skin.  Once the glue was fully dried, an Aquacell Ag and compressive dressing were applied.  Tthe patient was transported to the recovery room in stable condition.  Sponge, needle, and instrument counts were correct at the end of the case x2.  The patient tolerated the procedure well and there were no known complications.  Please note that a surgical assistant was a medical necessity for this procedure in order to perform it in a safe and expeditious manner. Surgical assistant was necessary to retract the ligaments and vital neurovascular structures to prevent injury to them and also necessary for proper positioning of the limb to allow for anatomic placement of the prosthesis.

## 2021-02-25 NOTE — Anesthesia Procedure Notes (Signed)
Spinal  Patient location during procedure: OR End time: 02/25/2021 8:49 AM Reason for block: surgical anesthesia Staffing Performed: resident/CRNA  Resident/CRNA: Caryl Pina T, CRNA Preanesthetic Checklist Completed: patient identified, IV checked, site marked, risks and benefits discussed, surgical consent, monitors and equipment checked, pre-op evaluation and timeout performed Spinal Block Patient position: sitting Prep: DuraPrep Patient monitoring: heart rate, cardiac monitor, continuous pulse ox and blood pressure Approach: midline Location: L3-4 Injection technique: single-shot Needle Needle type: Pencan  Needle gauge: 24 G Needle length: 9 cm Assessment Sensory level: T4 Events: CSF return Additional Notes IV functioning, monitors applied to pt. Expiration date of kit checked and confirmed to be in date. Sterile prep and drape, hand hygiene and sterile gloved used. Pt was positioned and spine was prepped in sterile fashion. Skin was anesthetized with lidocaine. Free flow of clear CSF obtained prior to injecting local anesthetic into CSF x 1 attempt. Spinal needle aspirated freely following injection. Needle was carefully withdrawn, and pt tolerated procedure well. Loss of motor and sensory on exam post injection.

## 2021-02-26 ENCOUNTER — Encounter (HOSPITAL_COMMUNITY): Payer: Self-pay | Admitting: Orthopedic Surgery

## 2021-03-04 ENCOUNTER — Other Ambulatory Visit (HOSPITAL_COMMUNITY): Payer: Self-pay | Admitting: Medical

## 2021-03-04 ENCOUNTER — Ambulatory Visit (HOSPITAL_COMMUNITY)
Admission: RE | Admit: 2021-03-04 | Discharge: 2021-03-04 | Disposition: A | Discharge status: Home / Self Care | Payer: 59 | Source: Ambulatory Visit | Attending: Cardiovascular Disease | Admitting: Cardiovascular Disease

## 2021-03-04 ENCOUNTER — Other Ambulatory Visit: Payer: Self-pay

## 2021-03-04 DIAGNOSIS — M79605 Pain in left leg: Secondary | ICD-10-CM | POA: Diagnosis present

## 2021-07-22 ENCOUNTER — Other Ambulatory Visit: Payer: Self-pay | Admitting: Surgery

## 2021-07-22 DIAGNOSIS — I7789 Other specified disorders of arteries and arterioles: Secondary | ICD-10-CM

## 2021-08-25 ENCOUNTER — Ambulatory Visit: Payer: 59

## 2021-08-25 ENCOUNTER — Other Ambulatory Visit: Payer: 59

## 2021-08-31 IMAGING — CT CT ANGIO CHEST
2 of 6 series · 13 of 36 positions shown · IV contrast (iopamidol)
Comparison: 04/21/2015

CLINICAL DATA: Follow-up thoracic aortic aneurysm

EXAM:
CT ANGIOGRAPHY CHEST WITH CONTRAST
TECHNIQUE: Multidetector CT imaging of the chest was performed using the
standard protocol during bolus administration of intravenous
contrast. Multiplanar CT image reconstructions and MIPs were
obtained to evaluate the vascular anatomy.
CONTRAST:  75mL 3YUNHS-AMP IOPAMIDOL (3YUNHS-AMP) INJECTION 76%

[Series 4: cta thorax 2.00 bv36 s3 axial arterial · axial · arterial · 0.91mm/px · z∈[+1557,+1817]mm · 12 of 154 slices shown]
[im 12/154  lung]
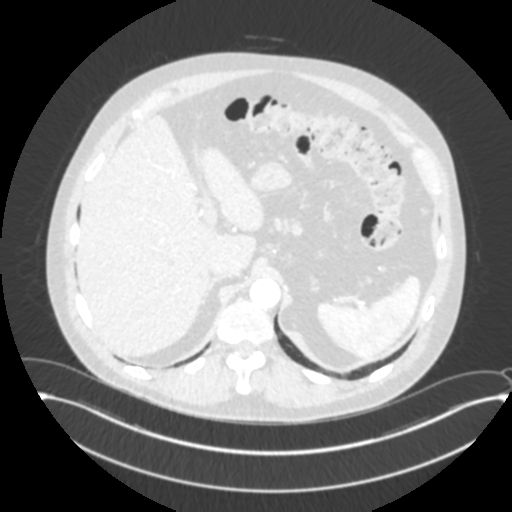
[im 24/154  mediastinal]
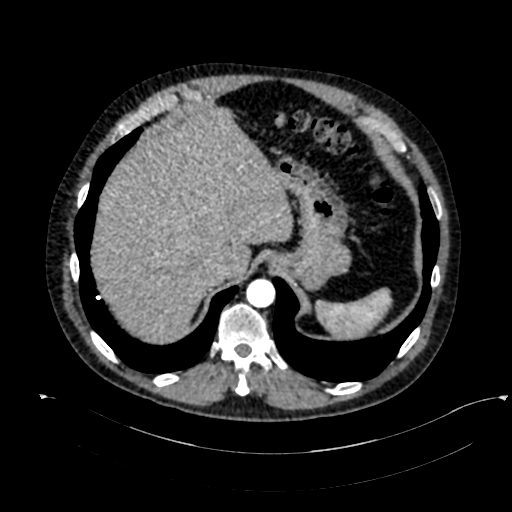
[im 36/154  lung]
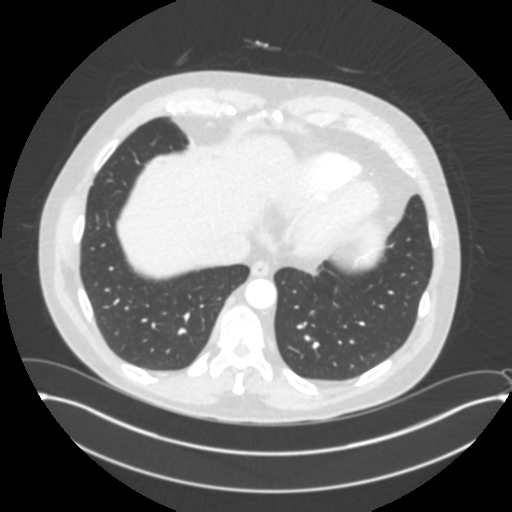
[im 48/154  mediastinal]
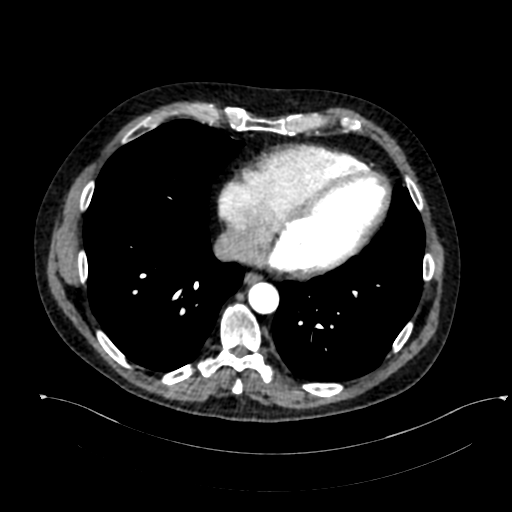
[im 59/154  lung]
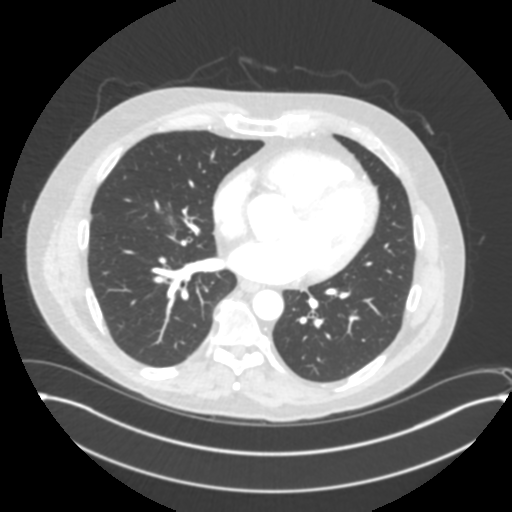
[im 71/154  mediastinal]
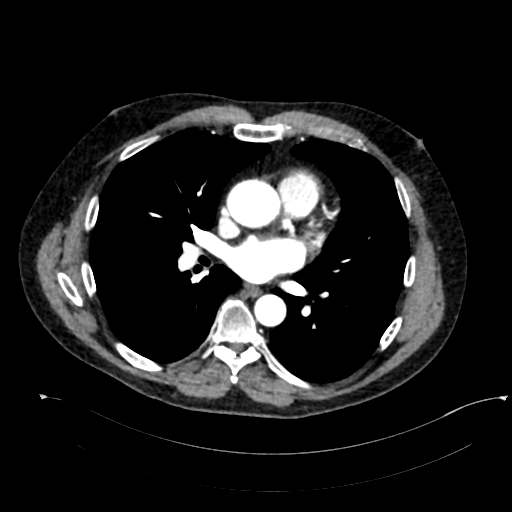
[im 83/154  lung]
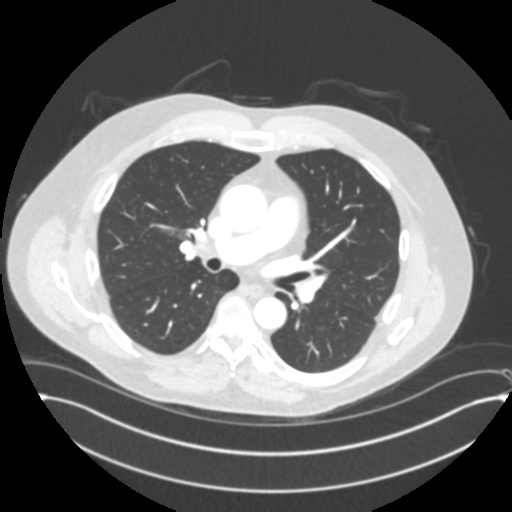
[im 95/154  mediastinal]
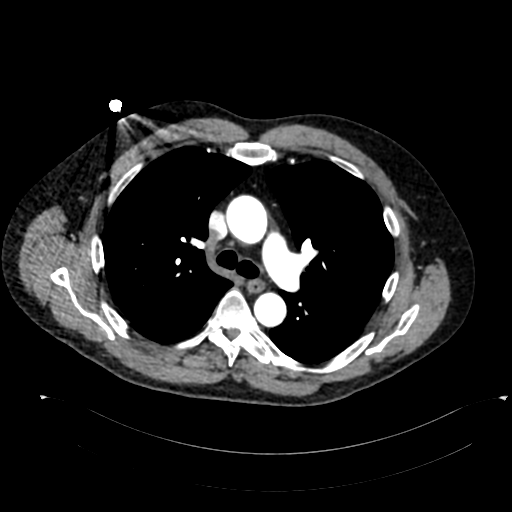
[im 106/154  lung]
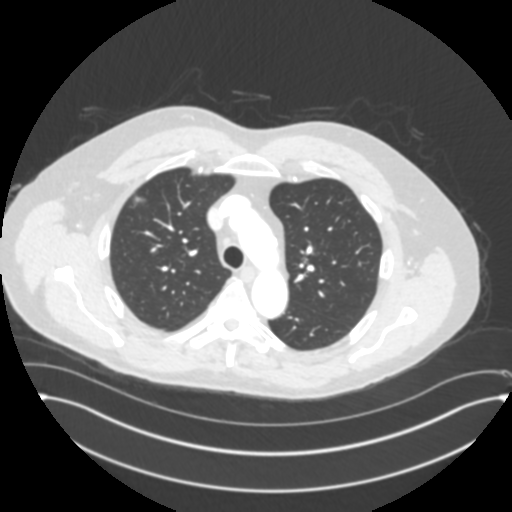
[im 118/154  mediastinal]
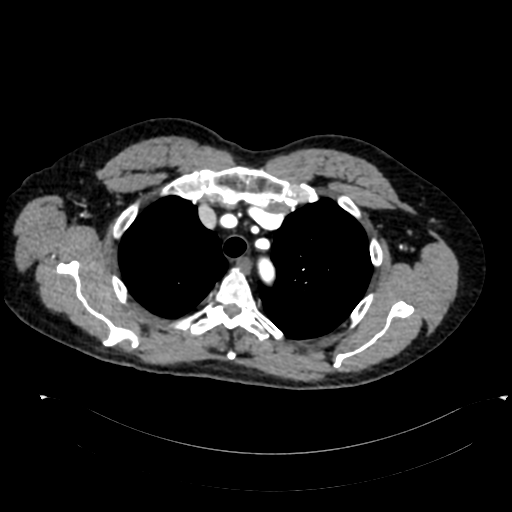
[im 130/154  lung]
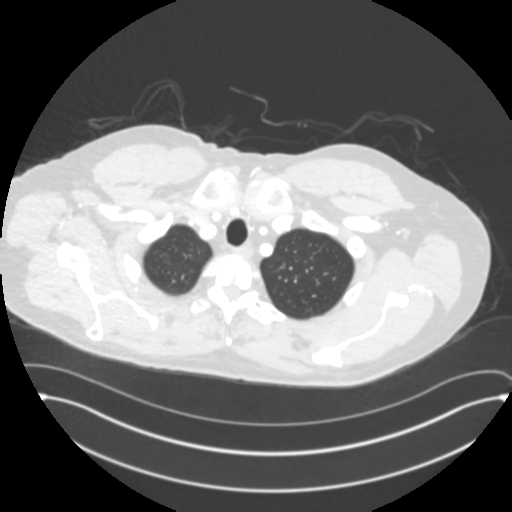
[im 142/154  mediastinal]
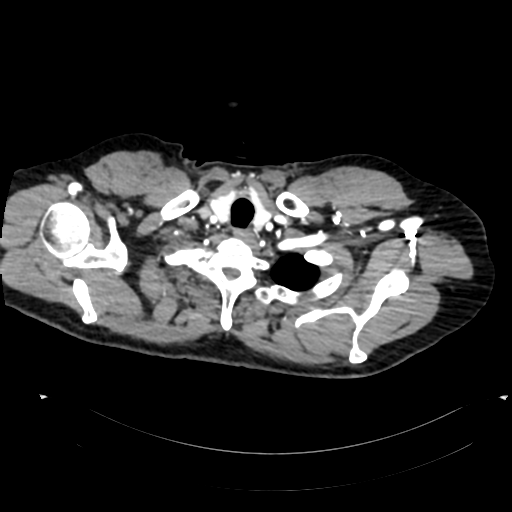

[Series 9: cta thorax 2.00 bv36 s3 cor st · coronal · 0.60mm/px · 1 of 233 slices shown]
[im 117/233  mediastinal]
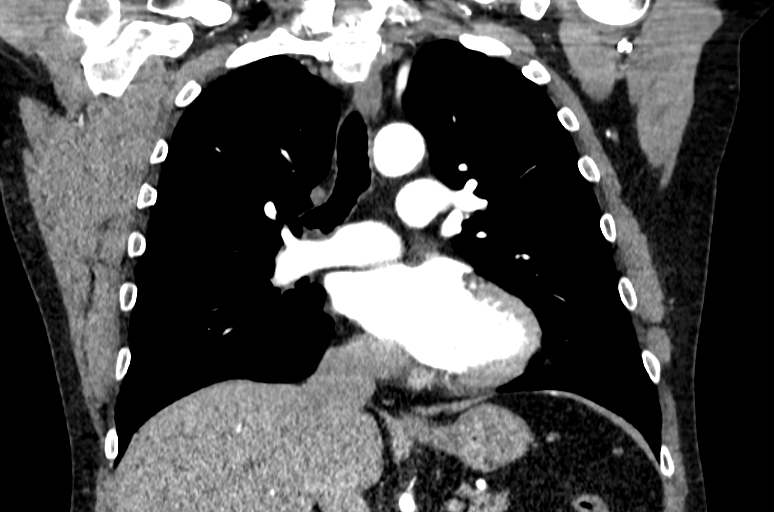

[13 of 36 positions shown; findings below may reference images not displayed]

FINDINGS: Cardiovascular: Normal heart size. No pericardial effusion.
Preferential opacification of the thoracic aorta. Maximal diameter
of the mid ascending thoracic aorta is 4.3 cm, unchanged from prior.
Diameter of the aortic arch and descending thoracic aorta are
normal. No aortic dissection. Three vessel arch, with widely patent
branch vessels. Minimal atherosclerotic calcification along the
aortic arch. Central pulmonary vasculature is within normal limits.
No central pulmonary arterial filling defect.

Mediastinum/Nodes: No enlarged mediastinal, hilar, or axillary lymph
nodes. Thyroid gland, trachea, and esophagus demonstrate no
significant findings.

Lungs/Pleura: Part solid pulmonary nodule within the peripheral
aspect of the right upper lobe measuring 10 mm in diameter with
solid component measuring approximately 8 mm (series 6, image 47),
new from prior. There are a few scattered areas of ground-glass
opacity within the right upper lobe and right middle lobe (for
example series 6, images 55 and 105). Additional small focal area of
ground-glass within the periphery of the left lower lobe (series 6,
image 90). Stable 5 mm calcified granuloma within the right lung
base. No pleural effusion or pneumothorax.

Upper Abdomen: No acute abnormality.

Musculoskeletal: No chest wall abnormality. No acute or significant
osseous findings.

Review of the MIP images confirms the above findings.
IMPRESSION: 1. Stable aneurysmal dilatation of the mid ascending thoracic aorta
measuring up to 4.3 cm in diameter.
2. Part solid pulmonary nodule within the peripheral aspect of the
right upper lobe measuring 10 mm in diameter, new from prior exam.
Follow-up non-contrast CT recommended at 3-6 months to confirm
persistence. If unchanged, and solid component is <6 mm, annual CT
is recommended until 5 years of stability has been established. If
persistent these nodules should be considered highly suspicious if
the solid component of the nodule is 6 mm or greater in size and
enlarging. This recommendation follows the consensus statement:
Guidelines for Management of Incidental Pulmonary Nodules Detected
[DATE].
3. There are a few scattered areas of ground-glass opacity within
the right upper lobe, right middle lobe, and left lower lobe.
Findings may represent an infectious or inflammatory process.
Attention on follow-up.

These results will be called to the ordering clinician or
representative by the Radiologist Assistant, and communication
documented in the PACS or [REDACTED].

## 2021-10-20 ENCOUNTER — Ambulatory Visit: Payer: Self-pay | Admitting: Student

## 2021-11-13 NOTE — Patient Instructions (Addendum)
SURGICAL WAITING ROOM VISITATION Patients having surgery or a procedure may have no more than 2 support people in the waiting area - these visitors may rotate.   Children under the age of 59 must have an adult with them who is not the patient. If the patient needs to stay at the hospital during part of their recovery, the visitor guidelines for inpatient rooms apply. Pre-op nurse will coordinate an appropriate time for 1 support person to accompany patient in pre-op.  This support person may not rotate.    Please refer to the [facility] website for the visitor guidelines for Inpatients (after your surgery is over and you are in a regular room).    Your procedure is scheduled on: 11/26/21   Report to [facility] Main Entrance    Report to admitting at 5:15 AM   Call this number if you have problems the morning of surgery [phone]   Do not eat food :After Midnight.   After Midnight you may have the following liquids until 4:30 AM DAY OF SURGERY  Water Non-Citrus Juices (without pulp, NO RED) Carbonated Beverages Black Coffee (NO MILK/CREAM OR CREAMERS, sugar ok)  Clear Tea (NO MILK/CREAM OR CREAMERS, sugar ok) regular and decaf                             Plain Jell-O (NO RED)                                           Fruit ices (not with fruit pulp, NO RED)                                     Popsicles (NO RED)                                                               Sports drinks like Gatorade (NO RED)                  The day of surgery:  Drink ONE (1) Pre-Surgery Clear Ensure at 4:30 AM the morning of surgery. Drink in one sitting. Do not sip.  This drink was given to you during your hospital  pre-op appointment visit. Nothing else to drink after completing the  Pre-Surgery Clear Ensure.          If you have questions, please contact your surgeon's office.   FOLLOW BOWEL PREP AND ANY ADDITIONAL PRE OP INSTRUCTIONS YOU RECEIVED FROM YOUR SURGEON'S  OFFICE!!!     Oral Hygiene is also important to reduce your risk of infection.                                    Remember - BRUSH YOUR TEETH THE MORNING OF SURGERY WITH YOUR REGULAR TOOTHPASTE   Take these medicines the morning of surgery with A SIP OF WATER: Tylenol  You may not have any metal on your body including  jewelry, and body piercing             Do not wear lotions, powders, cologne, or deodorant              Men may shave face and neck.   Do not bring valuables to the hospital. [place].   Contacts, dentures or bridgework may not be worn into surgery.  DO NOT [place]. PHARMACY WILL DISPENSE MEDICATIONS LISTED ON YOUR MEDICATION LIST TO YOU DURING YOUR ADMISSION [place]!    Patients discharged on the day of surgery will not be allowed to drive home.  Someone NEEDS to stay with you for the first 24 hours after anesthesia.              Please read over the following fact sheets you were given: IF YOU HAVE QUESTIONS ABOUT YOUR PRE-OP INSTRUCTIONS PLEASE CALL [place] - Preparing for Surgery Before surgery, you can play an important role.  Because skin is not sterile, your skin needs to be as free of germs as possible.  You can reduce the number of germs on your skin by washing with CHG (chlorahexidine gluconate) soap before surgery.  CHG is an antiseptic cleaner which kills germs and bonds with the skin to continue killing germs even after washing. Please DO NOT use if you have an allergy to CHG or antibacterial soaps.  If your skin becomes reddened/irritated stop using the CHG and inform your nurse when you arrive at Short Stay. Do not shave (including legs and underarms) for at least 48 hours prior to the first CHG shower.  You may shave your face/neck.  Please follow these instructions carefully:  1.  Shower with CHG Soap the  night before surgery and the  morning of surgery.  2.  If you choose to wash your hair, wash your hair first as usual with your normal  shampoo.  3.  After you shampoo, rinse your hair and body thoroughly to remove the shampoo.                             4.  Use CHG as you would any other liquid soap.  You can apply chg directly to the skin and wash.  Gently with a scrungie or clean washcloth.  5.  Apply the CHG Soap to your body ONLY FROM THE NECK DOWN.   Do   not use on face/ open                           Wound or open sores. Avoid contact with eyes, ears mouth and   genitals (private parts).                       Wash face,  Genitals (private parts) with your normal soap.             6.  Wash thoroughly, paying special attention to the area where your    surgery  will be performed.  7.  Thoroughly rinse your body with warm water from the neck down.  8.  DO NOT shower/wash with your normal soap after using and  rinsing off the CHG Soap.                9.  Pat yourself dry with a clean towel.            10.  Wear clean pajamas.            11.  Place clean sheets on your bed the night of your first shower and do not  sleep with pets. Day of Surgery : Do not apply any lotions/deodorants the morning of surgery.  Please wear clean clothes to the hospital/surgery center.  FAILURE TO FOLLOW THESE INSTRUCTIONS MAY RESULT IN THE CANCELLATION OF YOUR SURGERY  PATIENT SIGNATURE_________________________________  NURSE SIGNATURE__________________________________  ________________________________________________________________________   Adam Phenix  An incentive spirometer is a tool that can help keep your lungs clear and active. This tool measures how well you are filling your lungs with each breath. Taking long deep breaths may help reverse or decrease the chance of developing breathing (pulmonary) problems (especially infection) following: A long period of time when you are unable to  move or be active. BEFORE THE PROCEDURE  If the spirometer includes an indicator to show your best effort, your nurse or respiratory therapist will set it to a desired goal. If possible, sit up straight or lean slightly forward. Try not to slouch. Hold the incentive spirometer in an upright position. INSTRUCTIONS FOR USE  Sit on the edge of your bed if possible, or sit up as far as you can in bed or on a chair. Hold the incentive spirometer in an upright position. Breathe out normally. Place the mouthpiece in your mouth and seal your lips tightly around it. Breathe in slowly and as deeply as possible, raising the piston or the ball toward the top of the column. Hold your breath for 3-5 seconds or for as long as possible. Allow the piston or ball to fall to the bottom of the column. Remove the mouthpiece from your mouth and breathe out normally. Rest for a few seconds and repeat Steps 1 through 7 at least 10 times every 1-2 hours when you are awake. Take your time and take a few normal breaths between deep breaths. The spirometer may include an indicator to show your best effort. Use the indicator as a goal to work toward during each repetition. After each set of 10 deep breaths, practice coughing to be sure your lungs are clear. If you have an incision (the cut made at the time of surgery), support your incision when coughing by placing a pillow or rolled up towels firmly against it. Once you are able to get out of bed, walk around indoors and cough well. You may stop using the incentive spirometer when instructed by your caregiver.  RISKS AND COMPLICATIONS Take your time so you do not get dizzy or light-headed. If you are in pain, you may need to take or ask for pain medication before doing incentive spirometry. It is harder to take a deep breath if you are having pain. AFTER USE Rest and breathe slowly and easily. It can be helpful to keep track of a log of your progress. Your caregiver can  provide you with a simple table to help with this. If you are using the spirometer at home, follow these instructions: [place] IF:  You are having difficultly using the spirometer. You have trouble using the spirometer as often as instructed. Your pain medication is not giving enough relief while using the spirometer. You develop  fever of 100.5 F (38.1 C) or higher. SEEK IMMEDIATE MEDICAL CARE IF:  You cough up bloody sputum that had not been present before. You develop fever of 102 F (38.9 C) or greater. You develop worsening pain at or near the incision site. MAKE SURE YOU:  Understand these instructions. Will watch your condition. Will get help right away if you are not doing well or get worse. Document Released: 08/09/2006 Document Revised: 06/21/2011 Document Reviewed: 10/10/2006 [facility] Patient Information 2014 [place], [place].   ________________________________________________________________________

## 2021-11-13 NOTE — Progress Notes (Addendum)
COVID Vaccine Completed: no  Date of COVID positive in last 90 days: no  PCP - Orpah Melter, MD Cardiologist - [place]  Chest x-ray - n/a EKG - n/a Stress Test - n/a ECHO -  n/a Cardiac Cath - n/a Pacemaker/ICD device last checked: n/a Spinal Cord Stimulator: n/a  Bowel Prep - no  Sleep Study - n/a CPAP -   Fasting Blood Sugar - n/a Checks Blood Sugar _____ times a day  Blood Thinner Instructions:  Aspirin Instructions: ASA 81, stopped 10 days ago Last Dose:  Activity level: Can go up a flight of stairs and perform activities of daily living without stopping and without symptoms of chest pain or shortness of breath.   Anesthesia review:   Patient denies shortness of breath, fever, cough and chest pain at PAT appointment  Patient verbalized understanding of instructions that were given to them at the PAT appointment. Patient was also instructed that they will need to review over the PAT instructions again at home before surgery.

## 2021-11-16 ENCOUNTER — Encounter (HOSPITAL_COMMUNITY): Payer: Self-pay

## 2021-11-16 ENCOUNTER — Encounter (HOSPITAL_COMMUNITY)
Admission: RE | Admit: 2021-11-16 | Discharge: 2021-11-16 | Disposition: A | Discharge status: Home / Self Care | Payer: 59 | Source: Ambulatory Visit | Attending: Orthopedic Surgery | Admitting: Orthopedic Surgery

## 2021-11-16 VITALS — BP 127/90 | HR 70 | Temp 98.1°F | Resp 14 | Ht 71.5 in | Wt 223.8 lb

## 2021-11-16 DIAGNOSIS — Z01812 Encounter for preprocedural laboratory examination: Secondary | ICD-10-CM | POA: Diagnosis present

## 2021-11-16 DIAGNOSIS — Z01818 Encounter for other preprocedural examination: Secondary | ICD-10-CM

## 2021-11-16 HISTORY — DX: Malignant (primary) neoplasm, unspecified: C80.1

## 2021-11-16 LAB — SURGICAL PCR SCREEN
MRSA, PCR: NEGATIVE
Staphylococcus aureus: NEGATIVE

## 2021-11-16 LAB — CBC
HCT: 39.9 % (ref 39.0–52.0)
Hemoglobin: 13 g/dL (ref 13.0–17.0)
MCH: 29.5 pg (ref 26.0–34.0)
MCHC: 32.6 g/dL (ref 30.0–36.0)
MCV: 90.7 fL (ref 80.0–100.0)
Platelets: 317 10*3/uL (ref 150–400)
RBC: 4.4 MIL/uL (ref 4.22–5.81)
RDW: 13.6 % (ref 11.5–15.5)
WBC: 6.3 10*3/uL (ref 4.0–10.5)
nRBC: 0 % (ref 0.0–0.2)

## 2021-11-17 NOTE — Progress Notes (Signed)
Anesthesia Chart Review:   Case: [id number] Date/Time: 11/26/21 0715   Procedure: COMPUTER ASSISTED TOTAL KNEE ARTHROPLASTY (Right: Knee) - 150   Anesthesia type: Spinal   Pre-op diagnosis: Right knee osteoarthritis   Location[facility] ROOM 08 / [facility]   Surgeons: Rod Can, MD       DISCUSSION: Pt is 61 years old with ascending aortic aneurysm.   TAA was last checked in 08/2020, 1 year repeat CT and f/u with CT surgeon recommended but this has not happened.   I notified Morey Hummingbird in Dr. Sid Falcon office pt will need TAA imaging per CT surgery recommendations prior to elective surgery. CT now scheduled for 11/23/21.   VS: BP (!) 127/90   Pulse 70   Temp 36.7 C (Oral)   Resp 14   Ht 5' 11.5" (1.816 m)   Wt 101.5 kg   SpO2 97%   BMI 30.78 kg/m   PROVIDERS: - PCP is Orpah Melter, MD - CT surgeon is Gilford Raid, MD. Last office visit 08/13/21   LABS: Labs reviewed: Acceptable for surgery. (all labs ordered are listed, but only abnormal results are displayed)  Labs Reviewed  SURGICAL PCR SCREEN  CBC     IMAGES: CT chest 08/13/20:  1. Interval resolution of dominant part solid nodule in the right upper lobe and additional patchy ground-glass opacities in both lungs. No suspicious pulmonary nodules. 2. Interval development of several left-sided rib fractures posteriorly, including mildly displaced fractures of the left 6th and 7th ribs. These fractures appear subacute. 3. Stable dilatation of the ascending aorta having a maximal diameter of 4.3 cm. Mild Aortic Atherosclerosis   EKG: N/A   CV: N/A  Past Medical History:  Diagnosis Date   Allergic rhinitis    Ascending aorta enlargement (HCC)    Cancer (HCC)    lip   Joint pain    Pneumonia 1989   Sciatica    Thoracic aortic aneurysm without mention of rupture    Tubular adenoma    x 3    Past Surgical History:  Procedure Laterality Date   CARPAL TUNNEL RELEASE     LEFT HAND   COLONOSCOPY     07/2010    HERNIA REPAIR     HIP ARTHROPLASTY Bilateral    KNEE ARTHROPLASTY Left 02/25/2021   Procedure: COMPUTER ASSISTED TOTAL KNEE ARTHROPLASTY;  Surgeon: Rod Can, MD;  Location: [facility];  Service: Orthopedics;  Laterality: Left;   KNEE ARTHROSCOPY Left    1977   MEDIAL COLLATERAL LIGAMENT REPAIR, KNEE Left 1978   with pin   VASECTOMY      MEDICATIONS:  acetaminophen (TYLENOL) 500 MG tablet   aspirin EC 81 MG tablet   meloxicam (MOBIC) 15 MG tablet   triamcinolone cream (KENALOG) 0.1 %   No current facility-administered medications for this encounter.   Willeen Cass, PhD, FNP-BC [facility]/Anesthesiology Phone: [phone] 11/18/2021 10:54 AM

## 2021-11-23 ENCOUNTER — Ambulatory Visit
Admission: RE | Admit: 2021-11-23 | Discharge: 2021-11-23 | Disposition: A | Discharge status: Home / Self Care | Payer: 59 | Source: Ambulatory Visit | Attending: Surgery | Admitting: Surgery

## 2021-11-23 DIAGNOSIS — I7789 Other specified disorders of arteries and arterioles: Secondary | ICD-10-CM

## 2021-11-23 MED ORDER — IOPAMIDOL (ISOVUE-370) INJECTION 76%
75.0000 mL | Freq: Once | INTRAVENOUS | Status: AC | PRN
  Administered 2021-11-23: 75 mL via INTRAVENOUS

## 2021-11-24 ENCOUNTER — Ambulatory Visit: Payer: Self-pay | Admitting: Student

## 2021-11-24 NOTE — H&P (Unsigned)
TOTAL KNEE ADMISSION H&P  Patient is being admitted for right total knee arthroplasty.  Subjective:  Chief Complaint:right knee pain.  HPI: Dennis Le, 61 y.o. male, has a history of pain and functional disability in the right knee due to arthritis and has failed non-surgical conservative treatments for greater than 12 weeks to includeNSAID's and/or analgesics, corticosteriod injections, viscosupplementation injections, flexibility and strengthening excercises, supervised PT with diminished ADL's post treatment, use of assistive devices, and activity modification.  Onset of symptoms was gradual, starting 10 years ago with rapidlly worsening course since that time. The patient noted no past surgery on the right knee(s).  Patient currently rates pain in the right knee(s) at 10 out of 10 with activity. Patient has night pain, worsening of pain with activity and weight bearing, pain that interferes with activities of daily living, pain with passive range of motion, crepitus, and joint swelling.  Patient has evidence of subchondral cysts, subchondral sclerosis, periarticular osteophytes, and joint space narrowing by imaging studies. There is no active infection.  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   Joint pain    Pneumonia 1989   Sciatica    Thoracic aortic aneurysm without mention of rupture    Tubular adenoma    x 3    Past Surgical History:  Procedure Laterality Date   CARPAL TUNNEL RELEASE     LEFT HAND   COLONOSCOPY     07/2010   HERNIA REPAIR     HIP ARTHROPLASTY Bilateral    KNEE ARTHROPLASTY Left 02/25/2021   Procedure: COMPUTER ASSISTED TOTAL KNEE ARTHROPLASTY;  Surgeon: Rod Can,  MD;  Location: [facility];  Service: Orthopedics;  Laterality: Left;   KNEE ARTHROSCOPY Left    1977   MEDIAL COLLATERAL LIGAMENT REPAIR, KNEE Left 1978   with pin   VASECTOMY      Current Outpatient Medications  Medication Sig Dispense Refill Last Dose   acetaminophen (TYLENOL) 500 MG tablet Take 1,000 mg by mouth 2 (two) times daily.      aspirin EC 81 MG tablet Take 81 mg by mouth daily. Swallow whole.      meloxicam (MOBIC) 15 MG tablet Take 1 tablet (15 mg total) by mouth daily. 30 tablet 3    triamcinolone cream (KENALOG) 0.1 % Apply 1 Application topically daily.      No current facility-administered medications for this visit.   Allergies  Allergen Reactions   Sulfa Antibiotics Nausea Only    Social History   Tobacco Use   Smoking status: Never   Smokeless tobacco: Never  Substance Use Topics   Alcohol use: Yes    Alcohol/week: 1.0 standard drink of alcohol    Types: 1 Standard drinks or equivalent per week    Comment: 3-4 per week    No family history on file.   Review of Systems  Musculoskeletal:  Positive for arthralgias, gait problem, joint swelling and myalgias.  All other systems reviewed and are negative.   Objective:  Physical Exam Constitutional:      Appearance: Normal appearance.  HENT:     Head: Normocephalic and atraumatic.     Mouth/Throat:     Mouth: Mucous membranes  are moist.     Pharynx: Oropharynx is clear.  Eyes:     Extraocular Movements: Extraocular movements intact.  Cardiovascular:     Rate and Rhythm: Normal rate and regular rhythm.     Pulses: Normal pulses.     Heart sounds: Normal heart sounds.  Pulmonary:     Effort: Pulmonary effort is normal.     Breath sounds: Normal breath sounds.  Abdominal:     General: Abdomen is flat.     Palpations: Abdomen is soft.  Genitourinary:    Comments: deferred Musculoskeletal:     Cervical back: Normal range of motion and neck supple.     Right knee: Swelling and bony tenderness  present. No effusion or erythema. Decreased range of motion.  Skin:    General: Skin is warm and dry.     Capillary Refill: Capillary refill takes less than 2 seconds.  Neurological:     General: No focal deficit present.     Mental Status: He is alert and oriented to person, place, and time.  Psychiatric:        Mood and Affect: Mood normal.        Behavior: Behavior normal.        Thought Content: Thought content normal.        Judgment: Judgment normal.     Vital signs in last 24 hours: '@VSRANGES'$ @  Labs:   Estimated body mass index is 30.78 kg/m as calculated from the following:   Height as of 11/16/21: 5' 11.5" (1.816 m).   Weight as of 11/16/21: 101.5 kg.   Imaging Review Plain radiographs demonstrate severe degenerative joint disease of the right knee(s). The overall alignment ismild varus. The bone quality appears to be adequate for age and reported activity level.      Assessment/Plan:  End stage arthritis, right knee   The patient history, physical examination, clinical judgment of the provider and imaging studies are consistent with end stage degenerative joint disease of the right knee(s) and total knee arthroplasty is deemed medically necessary. The treatment options including medical management, injection therapy arthroscopy and arthroplasty were discussed at length. The risks and benefits of total knee arthroplasty were presented and reviewed. The risks due to aseptic loosening, infection, stiffness, patella tracking problems, thromboembolic complications and other imponderables were discussed. The patient acknowledged the explanation, agreed to proceed with the plan and consent was signed. Patient is being admitted for inpatient treatment for surgery, pain control, PT, OT, prophylactic antibiotics, VTE prophylaxis, progressive ambulation and ADL's and discharge planning. The patient is planning to be discharged home with OPPT.   Therapy Plans: outpatient physical  therapy Disposition: Home with wife Planned DVT Prophylaxis: aspirin '81mg'$  BID DME needed: None. Has equipment at home.  PCP: Cleared TXA: Yes Allergies: - Sulfa - photosensitivity Anesthesia Concerns: None BMI: 31.4 Last HgbA1c: 5.9 Other: - Already on meloxicam - Oxycodone, methocarbamol, zofran,  - Prior Lt TKA, Lt THA, right THA.     Patient's anticipated LOS is less than 2 midnights, meeting these requirements: - Younger than 43 - Lives within 1 hour of care - Has a competent adult at home to recover with post-op recover - NO history of  - Chronic pain requiring opiods  - Diabetes  - Coronary Artery Disease  - Heart failure  - Heart attack  - Stroke  - DVT/VTE  - Cardiac arrhythmia  - Respiratory Failure/COPD  - Renal failure  - Anemia  - Advanced Liver disease

## 2021-11-24 NOTE — H&P (View-Only) (Signed)
TOTAL KNEE ADMISSION H&P  Patient is being admitted for right total knee arthroplasty.  Subjective:  Chief Complaint:right knee pain.  HPI: SOL ENGLERT, 61 y.o. male, has a history of pain and functional disability in the right knee due to arthritis and has failed non-surgical conservative treatments for greater than 12 weeks to includeNSAID's and/or analgesics, corticosteriod injections, viscosupplementation injections, flexibility and strengthening excercises, supervised PT with diminished ADL's post treatment, use of assistive devices, and activity modification.  Onset of symptoms was gradual, starting 10 years ago with rapidlly worsening course since that time. The patient noted no past surgery on the right knee(s).  Patient currently rates pain in the right knee(s) at 10 out of 10 with activity. Patient has night pain, worsening of pain with activity and weight bearing, pain that interferes with activities of daily living, pain with passive range of motion, crepitus, and joint swelling.  Patient has evidence of subchondral cysts, subchondral sclerosis, periarticular osteophytes, and joint space narrowing by imaging studies. There is no active infection.  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   Joint pain    Pneumonia 1989   Sciatica    Thoracic aortic aneurysm without mention of rupture    Tubular adenoma    x 3    Past Surgical History:  Procedure Laterality Date   CARPAL TUNNEL RELEASE     LEFT HAND   COLONOSCOPY     07/2010   HERNIA REPAIR     HIP ARTHROPLASTY Bilateral    KNEE ARTHROPLASTY Left 02/25/2021   Procedure: COMPUTER ASSISTED TOTAL KNEE ARTHROPLASTY;  Surgeon: Rod Can,  MD;  Location: [facility];  Service: Orthopedics;  Laterality: Left;   KNEE ARTHROSCOPY Left    1977   MEDIAL COLLATERAL LIGAMENT REPAIR, KNEE Left 1978   with pin   VASECTOMY      Current Outpatient Medications  Medication Sig Dispense Refill Last Dose   acetaminophen (TYLENOL) 500 MG tablet Take 1,000 mg by mouth 2 (two) times daily.      aspirin EC 81 MG tablet Take 81 mg by mouth daily. Swallow whole.      meloxicam (MOBIC) 15 MG tablet Take 1 tablet (15 mg total) by mouth daily. 30 tablet 3    triamcinolone cream (KENALOG) 0.1 % Apply 1 Application topically daily.      No current facility-administered medications for this visit.   Allergies  Allergen Reactions   Sulfa Antibiotics Nausea Only    Social History   Tobacco Use   Smoking status: Never   Smokeless tobacco: Never  Substance Use Topics   Alcohol use: Yes    Alcohol/week: 1.0 standard drink of alcohol    Types: 1 Standard drinks or equivalent per week    Comment: 3-4 per week    No family history on file.   Review of Systems  Musculoskeletal:  Positive for arthralgias, gait problem, joint swelling and myalgias.  All other systems reviewed and are negative.   Objective:  Physical Exam Constitutional:      Appearance: Normal appearance.  HENT:     Head: Normocephalic and atraumatic.     Mouth/Throat:     Mouth: Mucous membranes  are moist.     Pharynx: Oropharynx is clear.  Eyes:     Extraocular Movements: Extraocular movements intact.  Cardiovascular:     Rate and Rhythm: Normal rate and regular rhythm.     Pulses: Normal pulses.     Heart sounds: Normal heart sounds.  Pulmonary:     Effort: Pulmonary effort is normal.     Breath sounds: Normal breath sounds.  Abdominal:     General: Abdomen is flat.     Palpations: Abdomen is soft.  Genitourinary:    Comments: deferred Musculoskeletal:     Cervical back: Normal range of motion and neck supple.     Right knee: Swelling and bony tenderness  present. No effusion or erythema. Decreased range of motion.  Skin:    General: Skin is warm and dry.     Capillary Refill: Capillary refill takes less than 2 seconds.  Neurological:     General: No focal deficit present.     Mental Status: He is alert and oriented to person, place, and time.  Psychiatric:        Mood and Affect: Mood normal.        Behavior: Behavior normal.        Thought Content: Thought content normal.        Judgment: Judgment normal.     Vital signs in last 24 hours: '@VSRANGES'$ @  Labs:   Estimated body mass index is 30.78 kg/m as calculated from the following:   Height as of 11/16/21: 5' 11.5" (1.816 m).   Weight as of 11/16/21: 101.5 kg.   Imaging Review Plain radiographs demonstrate severe degenerative joint disease of the right knee(s). The overall alignment ismild varus. The bone quality appears to be adequate for age and reported activity level.      Assessment/Plan:  End stage arthritis, right knee   The patient history, physical examination, clinical judgment of the provider and imaging studies are consistent with end stage degenerative joint disease of the right knee(s) and total knee arthroplasty is deemed medically necessary. The treatment options including medical management, injection therapy arthroscopy and arthroplasty were discussed at length. The risks and benefits of total knee arthroplasty were presented and reviewed. The risks due to aseptic loosening, infection, stiffness, patella tracking problems, thromboembolic complications and other imponderables were discussed. The patient acknowledged the explanation, agreed to proceed with the plan and consent was signed. Patient is being admitted for inpatient treatment for surgery, pain control, PT, OT, prophylactic antibiotics, VTE prophylaxis, progressive ambulation and ADL's and discharge planning. The patient is planning to be discharged home with OPPT.   Therapy Plans: outpatient physical  therapy Disposition: Home with wife Planned DVT Prophylaxis: aspirin '81mg'$  BID DME needed: None. Has equipment at home.  PCP: Cleared TXA: Yes Allergies: - Sulfa - photosensitivity Anesthesia Concerns: None BMI: 31.4 Last HgbA1c: 5.9 Other: - Already on meloxicam - Oxycodone, methocarbamol, zofran,  - Prior Lt TKA, Lt THA, right THA.     Patient's anticipated LOS is less than 2 midnights, meeting these requirements: - Younger than 24 - Lives within 1 hour of care - Has a competent adult at home to recover with post-op recover - NO history of  - Chronic pain requiring opiods  - Diabetes  - Coronary Artery Disease  - Heart failure  - Heart attack  - Stroke  - DVT/VTE  - Cardiac arrhythmia  - Respiratory Failure/COPD  - Renal failure  - Anemia  - Advanced Liver disease

## 2021-11-25 NOTE — Anesthesia Preprocedure Evaluation (Signed)
Anesthesia Evaluation  Patient identified by MRN, date of birth, ID band Patient awake    Reviewed: Allergy & Precautions, NPO status , Patient's Chart, lab work & pertinent test results  Airway Mallampati: II  TM Distance: >3 FB Neck ROM: Full    Dental no notable dental hx. (+) Teeth Intact, Dental Advisory Given   Pulmonary    Pulmonary exam normal breath sounds clear to auscultation       Cardiovascular + Peripheral Vascular Disease  Normal cardiovascular exam Rhythm:Regular Rate:Normal     Neuro/Psych  Neuromuscular disease negative psych ROS   GI/Hepatic negative GI ROS, Neg liver ROS,   Endo/Other  negative endocrine ROS  Renal/GU negative Renal ROS     Musculoskeletal  (+) Arthritis ,   Abdominal   Peds  Hematology Lab Results      Component                Value               Date                      WBC                      6.3                 11/16/2021                HGB                      13.0                11/16/2021                HCT                      39.9                11/16/2021                MCV                      90.7                11/16/2021                PLT                      317                 11/16/2021              Anesthesia Other Findings ALL: Sulfa  Reproductive/Obstetrics                            Anesthesia Physical Anesthesia Plan  ASA: 2  Anesthesia Plan: Spinal and Regional   Post-op Pain Management: Regional block* and Minimal or no pain anticipated   Induction:   PONV Risk Score and Plan: Treatment may vary due to age or medical condition, Midazolam, Ondansetron and Propofol infusion  Airway Management Planned: Nasal Cannula and Natural Airway  Additional Equipment: None  Intra-op Plan:   Post-operative Plan:   Informed Consent: I have reviewed the patients History and Physical, chart, labs and discussed the procedure  including the risks, benefits and alternatives for the proposed  anesthesia with the patient or authorized representative who has indicated his/her understanding and acceptance.     Dental advisory given  Plan Discussed with:   Anesthesia Plan Comments: (SP w R adductor canal)       Anesthesia Quick Evaluation

## 2021-11-26 ENCOUNTER — Ambulatory Visit (HOSPITAL_COMMUNITY): Payer: 59 | Admitting: Emergency Medicine

## 2021-11-26 ENCOUNTER — Other Ambulatory Visit: Payer: Self-pay

## 2021-11-26 ENCOUNTER — Encounter (HOSPITAL_COMMUNITY): Payer: Self-pay | Admitting: Orthopedic Surgery

## 2021-11-26 ENCOUNTER — Encounter (HOSPITAL_COMMUNITY)
Admission: RE | Disposition: A | Discharge status: Home / Self Care | Payer: Self-pay | Source: Ambulatory Visit | Attending: Orthopedic Surgery

## 2021-11-26 ENCOUNTER — Ambulatory Visit (HOSPITAL_COMMUNITY)
Admission: RE | Admit: 2021-11-26 | Discharge: 2021-11-26 | Disposition: A | Discharge status: Home / Self Care | Payer: 59 | Source: Ambulatory Visit | Attending: Orthopedic Surgery | Admitting: Orthopedic Surgery

## 2021-11-26 ENCOUNTER — Ambulatory Visit (HOSPITAL_BASED_OUTPATIENT_CLINIC_OR_DEPARTMENT_OTHER): Payer: 59 | Admitting: Anesthesiology

## 2021-11-26 ENCOUNTER — Ambulatory Visit (HOSPITAL_COMMUNITY): Payer: 59

## 2021-11-26 DIAGNOSIS — M1711 Unilateral primary osteoarthritis, right knee: Secondary | ICD-10-CM | POA: Diagnosis present

## 2021-11-26 HISTORY — PX: KNEE ARTHROPLASTY: SHX992

## 2021-11-26 SURGERY — ARTHROPLASTY, KNEE, TOTAL, USING IMAGELESS COMPUTER-ASSISTED NAVIGATION
Anesthesia: Regional | Site: Knee | Laterality: Right

## 2021-11-26 MED ORDER — ACETAMINOPHEN 10 MG/ML IV SOLN: 1000.0000 mg | Freq: Once | INTRAVENOUS | Status: DC | PRN

## 2021-11-26 MED ORDER — DOCUSATE SODIUM 100 MG PO CAPS: 100.0000 mg | ORAL_CAPSULE | Freq: Two times a day (BID) | ORAL | 1 refills | Status: AC

## 2021-11-26 MED ORDER — MIDAZOLAM HCL 5 MG/5ML IJ SOLN
INTRAMUSCULAR | Status: DC | PRN
  Administered 2021-11-26: 1 mg via INTRAVENOUS
  Administered 2021-11-26: 2 mg via INTRAVENOUS
  Administered 2021-11-26: 1 mg via INTRAVENOUS

## 2021-11-26 MED ORDER — METHOCARBAMOL 500 MG IVPB - SIMPLE MED
INTRAVENOUS | Status: AC
  Filled 2021-11-26: qty 55

## 2021-11-26 MED ORDER — ISOPROPYL ALCOHOL 70 % SOLN
Status: DC | PRN
  Administered 2021-11-26: 1 via TOPICAL

## 2021-11-26 MED ORDER — LIDOCAINE HCL (PF) 2 % IJ SOLN
INTRAMUSCULAR | Status: AC
  Filled 2021-11-26: qty 5

## 2021-11-26 MED ORDER — METHOCARBAMOL 500 MG PO TABS: 500.0000 mg | ORAL_TABLET | Freq: Four times a day (QID) | ORAL | Status: DC | PRN

## 2021-11-26 MED ORDER — ONDANSETRON HCL 4 MG/2ML IJ SOLN
INTRAMUSCULAR | Status: AC
  Filled 2021-11-26: qty 2

## 2021-11-26 MED ORDER — POVIDONE-IODINE 10 % EX SWAB: 2.0000 | Freq: Once | CUTANEOUS | Status: DC

## 2021-11-26 MED ORDER — CHLORHEXIDINE GLUCONATE 0.12 % MT SOLN
15.0000 mL | Freq: Once | OROMUCOSAL | Status: AC
  Administered 2021-11-26: 15 mL via OROMUCOSAL

## 2021-11-26 MED ORDER — OXYCODONE HCL 5 MG PO TABS: 5.0000 mg | ORAL_TABLET | ORAL | 0 refills | Status: AC | PRN

## 2021-11-26 MED ORDER — KETOROLAC TROMETHAMINE 15 MG/ML IJ SOLN
INTRAMUSCULAR | Status: AC
  Administered 2021-11-26: 15 mg via INTRAVENOUS
  Filled 2021-11-26: qty 1

## 2021-11-26 MED ORDER — CEFAZOLIN SODIUM-DEXTROSE 2-4 GM/100ML-% IV SOLN
2.0000 g | INTRAVENOUS | Status: AC
  Administered 2021-11-26: 2 g via INTRAVENOUS
  Filled 2021-11-26: qty 100

## 2021-11-26 MED ORDER — EPHEDRINE SULFATE (PRESSORS) 50 MG/ML IJ SOLN
INTRAMUSCULAR | Status: DC | PRN
  Administered 2021-11-26: 5 mg via INTRAVENOUS
  Administered 2021-11-26 (×2): 10 mg via INTRAVENOUS

## 2021-11-26 MED ORDER — ACETAMINOPHEN 500 MG PO TABS
1000.0000 mg | ORAL_TABLET | Freq: Once | ORAL | Status: DC
  Filled 2021-11-26: qty 2

## 2021-11-26 MED ORDER — KETOROLAC TROMETHAMINE 30 MG/ML IJ SOLN
INTRAMUSCULAR | Status: AC
  Filled 2021-11-26: qty 1

## 2021-11-26 MED ORDER — CLONIDINE HCL (ANALGESIA) 100 MCG/ML EP SOLN
EPIDURAL | Status: DC | PRN
  Administered 2021-11-26: 100 ug

## 2021-11-26 MED ORDER — DEXAMETHASONE SODIUM PHOSPHATE 10 MG/ML IJ SOLN
INTRAMUSCULAR | Status: AC
  Filled 2021-11-26: qty 1

## 2021-11-26 MED ORDER — POLYETHYLENE GLYCOL 3350 17 G PO PACK
17.0000 g | PACK | Freq: Every day | ORAL | 0 refills | Status: AC | PRN
Start: 2021-11-26 — End: ?

## 2021-11-26 MED ORDER — OXYCODONE HCL 5 MG/5ML PO SOLN: 5.0000 mg | Freq: Once | ORAL | Status: DC | PRN

## 2021-11-26 MED ORDER — MIDAZOLAM HCL 2 MG/2ML IJ SOLN
INTRAMUSCULAR | Status: AC
  Filled 2021-11-26: qty 2

## 2021-11-26 MED ORDER — AMISULPRIDE (ANTIEMETIC) 5 MG/2ML IV SOLN: 10.0000 mg | Freq: Once | INTRAVENOUS | Status: DC | PRN

## 2021-11-26 MED ORDER — FENTANYL CITRATE PF 50 MCG/ML IJ SOSY
PREFILLED_SYRINGE | INTRAMUSCULAR | Status: AC
  Filled 2021-11-26: qty 1

## 2021-11-26 MED ORDER — POVIDONE-IODINE 10 % EX SWAB
2.0000 | Freq: Once | CUTANEOUS | Status: AC
  Administered 2021-11-26: 2 via TOPICAL

## 2021-11-26 MED ORDER — FENTANYL CITRATE (PF) 100 MCG/2ML IJ SOLN
INTRAMUSCULAR | Status: DC | PRN
  Administered 2021-11-26: 50 ug via INTRAVENOUS

## 2021-11-26 MED ORDER — CEFAZOLIN SODIUM-DEXTROSE 2-4 GM/100ML-% IV SOLN
INTRAVENOUS | Status: AC
  Filled 2021-11-26: qty 100

## 2021-11-26 MED ORDER — SODIUM CHLORIDE (PF) 0.9 % IJ SOLN
INTRAMUSCULAR | Status: DC | PRN
  Administered 2021-11-26: 30 mL

## 2021-11-26 MED ORDER — BUPIVACAINE IN DEXTROSE 0.75-8.25 % IT SOLN
INTRATHECAL | Status: DC | PRN
  Administered 2021-11-26: 12.75 mg via INTRATHECAL

## 2021-11-26 MED ORDER — HYDROCODONE-ACETAMINOPHEN 5-325 MG PO TABS
ORAL_TABLET | ORAL | Status: AC
  Filled 2021-11-26: qty 2

## 2021-11-26 MED ORDER — HYDROCODONE-ACETAMINOPHEN 5-325 MG PO TABS
1.0000 | ORAL_TABLET | ORAL | Status: DC | PRN
  Administered 2021-11-26: 2 via ORAL

## 2021-11-26 MED ORDER — PROPOFOL 500 MG/50ML IV EMUL
INTRAVENOUS | Status: DC | PRN
  Administered 2021-11-26: 100 ug/kg/min via INTRAVENOUS

## 2021-11-26 MED ORDER — ORAL CARE MOUTH RINSE: 15.0000 mL | Freq: Once | OROMUCOSAL | Status: AC

## 2021-11-26 MED ORDER — LACTATED RINGERS IV BOLUS
250.0000 mL | Freq: Once | INTRAVENOUS | Status: AC
  Administered 2021-11-26: 250 mL via INTRAVENOUS

## 2021-11-26 MED ORDER — ONDANSETRON HCL 4 MG PO TABS: 4.0000 mg | ORAL_TABLET | Freq: Three times a day (TID) | ORAL | 0 refills | Status: AC | PRN

## 2021-11-26 MED ORDER — HYDROMORPHONE HCL 1 MG/ML IJ SOLN: 0.2500 mg | INTRAMUSCULAR | Status: DC | PRN

## 2021-11-26 MED ORDER — LACTATED RINGERS IV BOLUS
500.0000 mL | Freq: Once | INTRAVENOUS | Status: AC
  Administered 2021-11-26: 500 mL via INTRAVENOUS

## 2021-11-26 MED ORDER — BUPIVACAINE-EPINEPHRINE (PF) 0.5% -1:200000 IJ SOLN
INTRAMUSCULAR | Status: DC | PRN
  Administered 2021-11-26: 30 mL

## 2021-11-26 MED ORDER — ONDANSETRON HCL 4 MG/2ML IJ SOLN: 4.0000 mg | Freq: Once | INTRAMUSCULAR | Status: DC | PRN

## 2021-11-26 MED ORDER — DEXAMETHASONE SODIUM PHOSPHATE 4 MG/ML IJ SOLN
INTRAMUSCULAR | Status: DC | PRN
  Administered 2021-11-26: 5 mg via INTRAVENOUS

## 2021-11-26 MED ORDER — BUPIVACAINE-EPINEPHRINE (PF) 0.5% -1:200000 IJ SOLN
INTRAMUSCULAR | Status: AC
  Filled 2021-11-26: qty 30

## 2021-11-26 MED ORDER — ONDANSETRON HCL 4 MG/2ML IJ SOLN
INTRAMUSCULAR | Status: DC | PRN
  Administered 2021-11-26: 4 mg via INTRAVENOUS

## 2021-11-26 MED ORDER — PROPOFOL 1000 MG/100ML IV EMUL
INTRAVENOUS | Status: AC
Start: 2021-11-26 — End: ?
  Filled 2021-11-26: qty 100

## 2021-11-26 MED ORDER — SODIUM CHLORIDE (PF) 0.9 % IJ SOLN
INTRAMUSCULAR | Status: AC
Start: 2021-11-26 — End: ?
  Filled 2021-11-26: qty 50

## 2021-11-26 MED ORDER — METHOCARBAMOL 750 MG PO TABS: 750.0000 mg | ORAL_TABLET | Freq: Four times a day (QID) | ORAL | 0 refills | Status: AC | PRN

## 2021-11-26 MED ORDER — SODIUM CHLORIDE 0.9 % IR SOLN
Status: DC | PRN
  Administered 2021-11-26 (×2): 1000 mL

## 2021-11-26 MED ORDER — KETOROLAC TROMETHAMINE 15 MG/ML IJ SOLN: 15.0000 mg | Freq: Four times a day (QID) | INTRAMUSCULAR | Status: DC

## 2021-11-26 MED ORDER — METHOCARBAMOL 500 MG IVPB - SIMPLE MED
500.0000 mg | Freq: Four times a day (QID) | INTRAVENOUS | Status: DC | PRN
  Administered 2021-11-26: 500 mg via INTRAVENOUS

## 2021-11-26 MED ORDER — TRANEXAMIC ACID-NACL 1000-0.7 MG/100ML-% IV SOLN
1000.0000 mg | INTRAVENOUS | Status: AC
  Administered 2021-11-26: 1000 mg via INTRAVENOUS
  Filled 2021-11-26: qty 100

## 2021-11-26 MED ORDER — OXYCODONE HCL 5 MG PO TABS: 5.0000 mg | ORAL_TABLET | Freq: Once | ORAL | Status: DC | PRN

## 2021-11-26 MED ORDER — CEFAZOLIN SODIUM-DEXTROSE 2-4 GM/100ML-% IV SOLN: 2.0000 g | Freq: Four times a day (QID) | INTRAVENOUS | Status: DC

## 2021-11-26 MED ORDER — SENNA 8.6 MG PO TABS: 2.0000 | ORAL_TABLET | Freq: Every day | ORAL | 1 refills | Status: AC

## 2021-11-26 MED ORDER — ASPIRIN 81 MG PO CHEW: 81.0000 mg | CHEWABLE_TABLET | Freq: Two times a day (BID) | ORAL | 0 refills | Status: AC

## 2021-11-26 MED ORDER — MORPHINE SULFATE (PF) 2 MG/ML IV SOLN: 0.5000 mg | INTRAVENOUS | Status: DC | PRN

## 2021-11-26 MED ORDER — FENTANYL CITRATE (PF) 100 MCG/2ML IJ SOLN
INTRAMUSCULAR | Status: AC
  Filled 2021-11-26: qty 2

## 2021-11-26 MED ORDER — LACTATED RINGERS IV SOLN: INTRAVENOUS | Status: DC

## 2021-11-26 MED ORDER — PROPOFOL 10 MG/ML IV BOLUS
INTRAVENOUS | Status: AC
  Filled 2021-11-26: qty 20

## 2021-11-26 MED ORDER — MELOXICAM 15 MG PO TABS: 15.0000 mg | ORAL_TABLET | Freq: Every day | ORAL | 3 refills | Status: AC

## 2021-11-26 MED ORDER — LIDOCAINE HCL (CARDIAC) PF 100 MG/5ML IV SOSY
PREFILLED_SYRINGE | INTRAVENOUS | Status: DC | PRN
  Administered 2021-11-26: 60 mg via INTRAVENOUS
  Administered 2021-11-26: 40 mg via INTRAVENOUS

## 2021-11-26 MED ORDER — ROPIVACAINE HCL 5 MG/ML IJ SOLN
INTRAMUSCULAR | Status: DC | PRN
  Administered 2021-11-26: 30 mL via PERINEURAL

## 2021-11-26 MED ORDER — PHENYLEPHRINE 80 MCG/ML (10ML) SYRINGE FOR IV PUSH (FOR BLOOD PRESSURE SUPPORT)
PREFILLED_SYRINGE | INTRAVENOUS | Status: AC
  Filled 2021-11-26: qty 10

## 2021-11-26 MED ORDER — PHENYLEPHRINE HCL (PRESSORS) 10 MG/ML IV SOLN
INTRAVENOUS | Status: DC | PRN
  Administered 2021-11-26 (×4): 80 ug via INTRAVENOUS

## 2021-11-26 MED ORDER — GLYCOPYRROLATE 0.2 MG/ML IJ SOLN
INTRAMUSCULAR | Status: DC | PRN
  Administered 2021-11-26: .2 mg via INTRAVENOUS

## 2021-11-26 MED ORDER — KETOROLAC TROMETHAMINE 30 MG/ML IJ SOLN
INTRAMUSCULAR | Status: DC | PRN
  Administered 2021-11-26: 30 mg

## 2021-11-26 SURGICAL SUPPLY — 75 items
ADH SKN CLS APL DERMABOND .7 (GAUZE/BANDAGES/DRESSINGS) ×2
APL PRP STRL LF DISP 70% ISPRP (MISCELLANEOUS) ×2
BAG COUNTER SPONGE SURGICOUNT (BAG) IMPLANT
BAG SPEC THK2 15X12 ZIP CLS (MISCELLANEOUS)
BAG SPNG CNTER NS LX DISP (BAG) ×1
BAG ZIPLOCK 12X15 (MISCELLANEOUS) IMPLANT
BATTERY INSTRU NAVIGATION (MISCELLANEOUS) ×3 IMPLANT
BLADE SAW RECIPROCATING 77.5 (BLADE) ×1 IMPLANT
BNDG ELASTIC 4X5.8 VLCR STR LF (GAUZE/BANDAGES/DRESSINGS) ×1 IMPLANT
BNDG ELASTIC 6X5.8 VLCR STR LF (GAUZE/BANDAGES/DRESSINGS) ×1 IMPLANT
BTRY SRG DRVR LF (MISCELLANEOUS) ×3
CHLORAPREP W/TINT 26 (MISCELLANEOUS) ×2 IMPLANT
COMP FEM CMT PS STD 11 RT (Joint) ×1 IMPLANT
COMP PATELLAR 10X35 METAL (Joint) ×1 IMPLANT
COMP TIB PS KNEE 0D H RT (Joint) ×1 IMPLANT
COMPONENT FEM CMT PS STD 11 RT (Joint) IMPLANT
COMPONENT PATELLAR 10X35 METAL (Joint) IMPLANT
COMPONET TIB PS KNEE 0D H RT (Joint) IMPLANT
COVER SURGICAL LIGHT HANDLE (MISCELLANEOUS) ×1 IMPLANT
DERMABOND ADVANCED (GAUZE/BANDAGES/DRESSINGS) ×2
DERMABOND ADVANCED .7 DNX12 (GAUZE/BANDAGES/DRESSINGS) ×2 IMPLANT
DRAPE INCISE IOBAN 66X45 STRL (DRAPES) ×1 IMPLANT
DRAPE SHEET LG 3/4 BI-LAMINATE (DRAPES) ×3 IMPLANT
DRAPE U-SHAPE 47X51 STRL (DRAPES) ×1 IMPLANT
DRSG AQUACEL AG ADV 3.5X10 (GAUZE/BANDAGES/DRESSINGS) IMPLANT
DRSG AQUACEL AG ADV 3.5X14 (GAUZE/BANDAGES/DRESSINGS) ×1 IMPLANT
ELECT BLADE TIP CTD 4 INCH (ELECTRODE) ×1 IMPLANT
ELECT REM PT RETURN 15FT ADLT (MISCELLANEOUS) ×1 IMPLANT
GAUZE SPONGE 4X4 12PLY STRL (GAUZE/BANDAGES/DRESSINGS) ×1 IMPLANT
GLOVE BIO SURGEON STRL SZ7 (GLOVE) ×1 IMPLANT
GLOVE BIO SURGEON STRL SZ8.5 (GLOVE) ×2 IMPLANT
GLOVE BIOGEL PI IND STRL 7.5 (GLOVE) ×1 IMPLANT
GLOVE BIOGEL PI IND STRL 8.5 (GLOVE) ×1 IMPLANT
GLOVE BIOGEL PI INDICATOR 7.5 (GLOVE) ×1
GLOVE BIOGEL PI INDICATOR 8.5 (GLOVE) ×1
GOWN SPEC L3 XXLG W/TWL (GOWN DISPOSABLE) ×1 IMPLANT
GOWN STRL REUS W/ TWL XL LVL3 (GOWN DISPOSABLE) ×1 IMPLANT
GOWN STRL REUS W/TWL XL LVL3 (GOWN DISPOSABLE) ×1
HANDPIECE INTERPULSE COAX TIP (DISPOSABLE) ×1
HDLS TROCR DRIL PIN KNEE 75 (PIN) ×1
HOLDER FOLEY CATH W/STRAP (MISCELLANEOUS) ×1 IMPLANT
HOOD PEEL AWAY FLYTE STAYCOOL (MISCELLANEOUS) ×3 IMPLANT
INSERT TIB ASF PS 7-12 10 GH (Insert) IMPLANT
KIT TURNOVER KIT A (KITS) IMPLANT
MARKER SKIN DUAL TIP RULER LAB (MISCELLANEOUS) ×1 IMPLANT
NDL SAFETY ECLIPSE 18X1.5 (NEEDLE) ×1 IMPLANT
NDL SPNL 18GX3.5 QUINCKE PK (NEEDLE) ×1 IMPLANT
NEEDLE HYPO 18GX1.5 SHARP (NEEDLE) ×1
NEEDLE SPNL 18GX3.5 QUINCKE PK (NEEDLE) ×1 IMPLANT
NS IRRIG 1000ML POUR BTL (IV SOLUTION) ×1 IMPLANT
PACK ICE MAXI GEL EZY WRAP (MISCELLANEOUS) IMPLANT
PACK TOTAL KNEE CUSTOM (KITS) ×1 IMPLANT
PADDING CAST COTTON 6X4 STRL (CAST SUPPLIES) ×1 IMPLANT
PIN DRILL HDLS TROCAR 75 4PK (PIN) IMPLANT
PROTECTOR NERVE ULNAR (MISCELLANEOUS) ×1 IMPLANT
SAW OSC TIP CART 19.5X105X1.3 (SAW) ×1 IMPLANT
SCREW FEMALE HEX FIX 25X2.5 (ORTHOPEDIC DISPOSABLE SUPPLIES) IMPLANT
SEALER BIPOLAR AQUA 6.0 (INSTRUMENTS) ×1 IMPLANT
SET HNDPC FAN SPRY TIP SCT (DISPOSABLE) ×1 IMPLANT
SET PAD KNEE POSITIONER (MISCELLANEOUS) ×1 IMPLANT
SOLUTION PRONTOSAN WOUND 350ML (IRRIGATION / IRRIGATOR) IMPLANT
SPIKE FLUID TRANSFER (MISCELLANEOUS) ×2 IMPLANT
SUT MNCRL AB 3-0 PS2 18 (SUTURE) ×1 IMPLANT
SUT MNCRL AB 4-0 PS2 18 (SUTURE) ×1 IMPLANT
SUT MON AB 2-0 CT1 36 (SUTURE) ×1 IMPLANT
SUT STRATAFIX PDO 1 14 VIOLET (SUTURE) ×1
SUT STRATFX PDO 1 14 VIOLET (SUTURE) ×1
SUT VIC AB 1 CTX 36 (SUTURE) ×2
SUT VIC AB 1 CTX36XBRD ANBCTR (SUTURE) ×2 IMPLANT
SUT VIC AB 2-0 CT1 27 (SUTURE) ×1
SUT VIC AB 2-0 CT1 TAPERPNT 27 (SUTURE) ×1 IMPLANT
SUTURE STRATFX PDO 1 14 VIOLET (SUTURE) ×1 IMPLANT
TRAY FOLEY MTR SLVR 16FR STAT (SET/KITS/TRAYS/PACK) IMPLANT
TUBE SUCTION HIGH CAP CLEAR NV (SUCTIONS) ×1 IMPLANT
WATER STERILE IRR 1000ML POUR (IV SOLUTION) ×2 IMPLANT

## 2021-11-26 NOTE — Discharge Instructions (Signed)

## 2021-11-26 NOTE — Anesthesia Procedure Notes (Signed)
Spinal  Patient location during procedure: OR Start time: 11/26/2021 7:26 AM End time: 11/26/2021 7:30 AM Reason for block: surgical anesthesia Staffing Performed: anesthesiologist  Anesthesiologist: Barnet Glasgow, MD Performed by: Barnet Glasgow, MD Authorized by: Barnet Glasgow, MD   Preanesthetic Checklist Completed: patient identified, IV checked, risks and benefits discussed, surgical consent, monitors and equipment checked, pre-op evaluation and timeout performed Spinal Block Patient position: sitting Prep: DuraPrep and site prepped and draped Patient monitoring: heart rate, cardiac monitor, continuous pulse ox and blood pressure Approach: midline Location: L3-4 Injection technique: single-shot Needle Needle type: Pencan  Needle gauge: 24 G Needle length: 10 cm Needle insertion depth: 7 cm Assessment Sensory level: T4 Events: CSF return Additional Notes 1 Attempt (s). Pt tolerated procedure well.

## 2021-11-26 NOTE — Anesthesia Procedure Notes (Signed)
Anesthesia Regional Block: Adductor canal block   Pre-Anesthetic Checklist: , timeout performed,  Correct Patient, Correct Site, Correct Laterality,  Correct Procedure, Correct Position, site marked,  Risks and benefits discussed,  Surgical consent,  Pre-op evaluation,  At surgeon's request and post-op pain management  Laterality: Lower and Right  Prep: chloraprep       Needles:  Injection technique: Single-shot  Needle Type: Echogenic Needle     Needle Length: 9cm  Needle Gauge: 22     Additional Needles:   Procedures:,,,, ultrasound used (permanent image in chart),,    Narrative:  Start time: 11/26/2021 6:54 AM End time: 11/26/2021 7:00 AM Injection made incrementally with aspirations every 5 mL.  Performed by: Personally  Anesthesiologist: Barnet Glasgow, MD  Additional Notes: Block assessed prior to surgery. Pt tolerated procedure well.

## 2021-11-26 NOTE — Anesthesia Postprocedure Evaluation (Signed)
Anesthesia Post Note  Patient: Dennis Le  Procedure(s) Performed: COMPUTER ASSISTED TOTAL KNEE ARTHROPLASTY (Right: Knee)     Patient location during evaluation: Nursing Unit Anesthesia Type: Regional and Spinal Level of consciousness: oriented and awake and alert Pain management: pain level controlled Vital Signs Assessment: post-procedure vital signs reviewed and stable Respiratory status: spontaneous breathing and respiratory function stable Cardiovascular status: blood pressure returned to baseline and stable Postop Assessment: no headache, no backache, no apparent nausea or vomiting and patient able to bend at knees Anesthetic complications: no   No notable events documented.  Last Vitals:  Vitals:   11/26/21 1100 11/26/21 1200  BP: 116/64 121/78  Pulse: (!) 57 68  Resp: 16 14  Temp:    SpO2: 91% 97%    Last Pain:  Vitals:   11/26/21 1200  TempSrc:   PainSc: 4                  Barnet Glasgow

## 2021-11-26 NOTE — Op Note (Signed)
OPERATIVE REPORT  SURGEON: Rod Can, MD   ASSISTANT: Larene Pickett, PA-C  PREOPERATIVE DIAGNOSIS: Primary Right knee arthritis.   POSTOPERATIVE DIAGNOSIS: Primary Right knee arthritis.   PROCEDURE: Computer assisted Right total knee arthroplasty.   IMPLANTS: Zimmer Persona PPS Cementless CR femur, size 11. Persona 0 degree Spiked Keel OsseoTi Tibia, size H. Vivacit-E polyethelyene insert, size 10 mm, CR. TM standard patella, size 35 mm.  ANESTHESIA:  MAC, Regional, and Spinal  TOURNIQUET TIME: Not utilized.   ESTIMATED BLOOD LOSS:-150 mL    ANTIBIOTICS: 2g Ancef.  DRAINS: None.  COMPLICATIONS: None   CONDITION: PACU - hemodynamically stable.   BRIEF CLINICAL NOTE: Dennis Le is a 61 y.o. male with a long-standing history of Right knee arthritis. After failing conservative management, the patient was indicated for total knee arthroplasty. The risks, benefits, and alternatives to the procedure were explained, and the patient elected to proceed.  PROCEDURE IN DETAIL: Adductor canal block was obtained in the pre-op holding area. Once inside the operative room, spinal anesthesia was obtained, and a foley catheter was inserted. The patient was then positioned and the lower extremity was prepped and draped in the normal sterile surgical fashion.  A time-out was called verifying side and site of surgery. The patient received IV antibiotics within 60 minutes of beginning the procedure. A tourniquet was not utilized.   An anterior approach to the knee was performed utilizing a midvastus arthrotomy. A medial release was performed and the patellar fat pad was excised. Stryker imageless navigation was used to cut the distal femur perpendicular to the mechanical axis. A freehand patellar resection was performed, and the patella was sized an prepared with 3 lug holes.  Nagivation was used to make a neutral proximal tibia resection, taking 6 mm of bone from the less affected medial  side with 3 degrees of slope. The menisci were excised. A spacer block was placed, and the alignment and balance in extension were confirmed.   The distal femur was sized using the 3-degree external rotation guide referencing the posterior femoral cortex. The appropriate 4-in-1 cutting block was pinned into place. Rotation was checked using Whiteside's line, the epicondylar axis, and then confirmed with a spacer block in flexion. The remaining femoral cuts were performed, taking care to protect the MCL.  The tibia was sized and the trial tray was pinned into place. The remaining trail components were inserted. The knee was stable to varus and valgus stress through a full range of motion. The patella tracked centrally, and the PCL was well balanced. The trial components were removed, and the proximal tibial surface was prepared. Final components were impacted into place. The knee was tested for a final time and found to be well balanced.   The wound was copiously irrigated with Irrisept solution and normal saline using pule lavage.  Marcaine solution was injected into the periarticular soft tissue.  The wound was closed in layers using #1 Vicryl and Stratafix for the fascia, 2-0 Vicryl for the subcutaneous fat, 2-0 Monocryl for the deep dermal layer, 3-0 running Monocryl subcuticular Stitch, and 4-0 Monocryl stay sutures at both ends of the wound. Dermabond was applied to the skin.  Once the glue was fully dried, an Aquacell Ag and compressive dressing were applied.  The patient was transported to the recovery room in stable condition.  Sponge, needle, and instrument counts were correct at the end of the case x2.  The patient tolerated the procedure well and there were no known complications.  Please note that a surgical assistant was a medical necessity for this procedure in order to perform it in a safe and expeditious manner. Surgical assistant was necessary to retract the ligaments and vital neurovascular  structures to prevent injury to them and also necessary for proper positioning of the limb to allow for anatomic placement of the prosthesis.

## 2021-11-26 NOTE — Transfer of Care (Signed)
Immediate Anesthesia Transfer of Care Note  Patient: Dennis Le  Procedure(s) Performed: Procedure(s) (LRB): COMPUTER ASSISTED TOTAL KNEE ARTHROPLASTY (Right)  Patient Location: PACU  Anesthesia Type: SAB   Level of Consciousness: awake, sedated, patient cooperative and responds to stimulation  Airway & Oxygen Therapy: Patient Spontanous Breathing and Patient on RA   Post-op Assessment: Report given to PACU RN, Post -op Vital signs reviewed and stable and Patient moving all extremities  Post vital signs: Reviewed and stable  Complications: No apparent anesthesia complications

## 2021-11-26 NOTE — Anesthesia Procedure Notes (Signed)
Procedure Name: MAC Date/Time: 11/26/2021 7:38 AM  Performed by: Justice Rocher, CRNAPre-anesthesia Checklist: Patient identified, Emergency Drugs available, Suction available, Patient being monitored and Timeout performed Patient Re-evaluated:Patient Re-evaluated prior to induction Oxygen Delivery Method: Simple face mask Induction Type: IV induction Placement Confirmation: breath sounds checked- equal and bilateral, CO2 detector and positive ETCO2

## 2021-11-26 NOTE — Evaluation (Signed)
Physical Therapy Evaluation Patient Details Name: Dennis Le MRN: [id number] DOB: [birth year] Today's Date: 11/26/2021  History of Present Illness  Pt is a 61yo male presenting s/p R-TKA on 11/26/21. PMH: sciatica, hx of thoracic aortic aneurysm, L-TKA 2022, bilateral THA.  Clinical Impression  Dennis Le is a 61 y.o. male POD 0 s/p R-TKA. Patient reports independence with mobility at baseline. Patient is now limited by functional impairments (see PT problem list below) and requires min guard for transfers and gait with RW. Patient was able to ambulate 60 feet with RW and min guard and cues for safe walker management. Patient educated on safe sequencing for stair mobility and verbalized safe guarding position for people assisting with mobility. Patient instructed in exercises to facilitate ROM and circulation. Patient will benefit from continued skilled PT interventions to address impairments and progress towards PLOF. Patient has met mobility goals at adequate level for discharge home; will continue to follow if pt continues acute stay to progress towards Mod I goals.        Recommendations for follow up therapy are one component of a multi-disciplinary discharge planning process, led by the attending physician.  Recommendations may be updated based on patient status, additional functional criteria and insurance authorization.  Follow Up Recommendations Follow physician's recommendations for discharge plan and follow up therapies      Assistance Recommended at Discharge Set up Supervision/Assistance  Patient can return home with the following  A little help with walking and/or transfers;A little help with bathing/dressing/bathroom;Assistance with cooking/housework;Assist for transportation;Help with stairs or ramp for entrance    Equipment Recommendations None recommended by PT  Recommendations for Other Services       Functional Status Assessment Patient has had a recent  decline in their functional status and demonstrates the ability to make significant improvements in function in a reasonable and predictable amount of time.     Precautions / Restrictions Precautions Precautions: None Restrictions Weight Bearing Restrictions: No Other Position/Activity Restrictions: wbat      Mobility  Bed Mobility Overal bed mobility: Modified Independent             General bed mobility comments: Increased time    Transfers Overall transfer level: Needs assistance Equipment used: Rolling walker (2 wheels) Transfers: Sit to/from Stand Sit to Stand: Min guard, From elevated surface           General transfer comment: Min guard from elevated surface, no physical assist required, VCs for powering up from bed surface    Ambulation/Gait Ambulation/Gait assistance: Supervision, Min guard Gait Distance (Feet): 60 Feet Assistive device: Rolling walker (2 wheels) Gait Pattern/deviations: Step-to pattern, Step-through pattern Gait velocity: decreased     General Gait Details: Pt ambulated with RW and min guard progressed to supervision 43ft with step-to pattern progressed to step through pattern, no physical assist required or overt LOB noted.  Stairs Stairs: Yes Stairs assistance: Min assist, Supervision Stair Management: No rails, Step to pattern, Backwards, With walker Number of Stairs: 4 General stair comments: Pt educated on stair mobility, verbalized understanding. Demonstrated safe technique with VCs, no overt LOB. First repetition PT provided min assist for steadying of RW; second repetition wife provided min assist with PT supervision.  Wheelchair Mobility    Modified Rankin (Stroke Patients Only)       Balance Overall balance assessment: Mild deficits observed, not formally tested  Pertinent Vitals/Pain Pain Assessment Pain Assessment: 0-10 Pain Score: 7  Pain Location:  right knee Pain Descriptors / Indicators: Operative site guarding Pain Intervention(s): Limited activity within patient's tolerance, Monitored during session, Repositioned, Ice applied    Home Living Family/patient expects to be discharged to:: Private residence Living Arrangements: Spouse/significant other Available Help at Discharge: Family;Available 24 hours/day Type of Home: House Home Access: Stairs to enter Entrance Stairs-Rails: None Entrance Stairs-Number of Steps: 3   Home Layout: One level Home Equipment: Tub bench;Toilet riser;Rolling Walker (2 wheels);Cane - single point      Prior Function Prior Level of Function : Independent/Modified Independent;Driving;History of Falls (last six months)             Mobility Comments: IND ADLs Comments: IND     Hand Dominance   Dominant Hand: Left    Extremity/Trunk Assessment   Upper Extremity Assessment Upper Extremity Assessment: Overall WFL for tasks assessed    Lower Extremity Assessment Lower Extremity Assessment: RLE deficits/detail;LLE deficits/detail RLE Deficits / Details: MMT ank DF/PF 5/5, no extensor lag noted RLE Sensation: WNL LLE Deficits / Details: MMT ank DF/PF 5/5 LLE Sensation: WNL    Cervical / Trunk Assessment Cervical / Trunk Assessment: Normal  Communication   Communication: No difficulties  Cognition Arousal/Alertness: Awake/alert Behavior During Therapy: WFL for tasks assessed/performed Overall Cognitive Status: Within Functional Limits for tasks assessed                                          General Comments General comments (skin integrity, edema, etc.): Wife Wells Guiles present for session    Exercises Total Joint Exercises Ankle Circles/Pumps: AROM, Both, 10 reps Quad Sets: AROM, Right, Other reps (comment) (3) Short Arc Quad: AROM, Right, Other reps (comment) (3) Heel Slides: AROM, Right, 5 reps Hip ABduction/ADduction: AROM, Right, Other reps (comment)  (3) Straight Leg Raises: AROM, Right, Other reps (comment) (3) Goniometric ROM: visually ~0-90   Assessment/Plan    PT Assessment Patient needs continued PT services  PT Problem List Decreased strength;Decreased range of motion;Decreased activity tolerance;Decreased balance;Decreased mobility;Decreased coordination;Pain       PT Treatment Interventions Patient/family education;Neuromuscular re-education;Balance training;Therapeutic exercise;Therapeutic activities;Functional mobility training;Stair training;Gait training;DME instruction    PT Goals (Current goals can be found in the Care Plan section)  Acute Rehab PT Goals Patient Stated Goal: Gardening PT Goal Formulation: With patient Time For Goal Achievement: 12/03/21 Potential to Achieve Goals: Good    Frequency 7X/week     Co-evaluation               AM-PAC PT "6 Clicks" Mobility  Outcome Measure Help needed turning from your back to your side while in a flat bed without using bedrails?: None Help needed moving from lying on your back to sitting on the side of a flat bed without using bedrails?: None Help needed moving to and from a bed to a chair (including a wheelchair)?: A Little Help needed standing up from a chair using your arms (e.g., wheelchair or bedside chair)?: A Little Help needed to walk in hospital room?: A Little Help needed climbing 3-5 steps with a railing? : A Little 6 Click Score: 20    End of Session Equipment Utilized During Treatment: Gait belt Activity Tolerance: Patient tolerated treatment well;No increased pain Patient left: in chair;with call bell/phone within reach;with family/visitor present Nurse Communication: Mobility status PT Visit Diagnosis:  Pain;Difficulty in walking, not elsewhere classified (R26.2) Pain - Right/Left: Right Pain - part of body: Knee    Time: 1230-1305 PT Time Calculation (min) (ACUTE ONLY): 35 min   Charges:   PT Evaluation $PT Eval Low Complexity: 1  Low PT Treatments $Gait Training: 8-22 mins        Coolidge Breeze, PT, DPT [facility] Department Office: [phone] Pager: [phone]  Coolidge Breeze 11/26/2021, 1:42 PM

## 2021-11-26 NOTE — Interval H&P Note (Signed)
History and Physical Interval Note:  11/26/2021 6:49 AM  Dennis Le  has presented today for surgery, with the diagnosis of Right knee osteoarthritis.  The various methods of treatment have been discussed with the patient and family. After consideration of risks, benefits and other options for treatment, the patient has consented to  Procedure(s) with comments: [place] (Right) - 150 as a surgical intervention.  The patient's history has been reviewed, patient examined, no change in status, stable for surgery.  I have reviewed the patient's chart and labs.  Questions were answered to the patient's satisfaction.     Hilton Cork Mildred Bollard

## 2021-11-27 ENCOUNTER — Encounter (HOSPITAL_COMMUNITY): Payer: Self-pay | Admitting: Orthopedic Surgery
